# Patient Record
Sex: Female | Born: 1950 | Race: White | Hispanic: No | Marital: Married | Smoking: Never smoker
Health system: Southern US, Community
[De-identification: ages and names within clinical notes are randomized; demographics above are authoritative.]

## PROBLEM LIST (undated history)

## (undated) DIAGNOSIS — I10 Essential (primary) hypertension: Secondary | ICD-10-CM

## (undated) DIAGNOSIS — I493 Ventricular premature depolarization: Secondary | ICD-10-CM

## (undated) DIAGNOSIS — E7800 Pure hypercholesterolemia, unspecified: Secondary | ICD-10-CM

## (undated) DIAGNOSIS — M8580 Other specified disorders of bone density and structure, unspecified site: Secondary | ICD-10-CM

## (undated) HISTORY — PX: NASAL SINUS SURGERY: SHX719

## (undated) HISTORY — DX: Pure hypercholesterolemia, unspecified: E78.00

## (undated) HISTORY — PX: DILATION AND CURETTAGE OF UTERUS: SHX78

## (undated) HISTORY — DX: Other specified disorders of bone density and structure, unspecified site: M85.80

## (undated) HISTORY — DX: Ventricular premature depolarization: I49.3

## (undated) HISTORY — DX: Essential (primary) hypertension: I10

## (undated) HISTORY — PX: HYSTEROSCOPY: SHX211

## (undated) HISTORY — PX: CERVICAL DISC SURGERY: SHX588

## (undated) HISTORY — PX: ELBOW SURGERY: SHX618

## (undated) HISTORY — PX: SHOULDER SURGERY: SHX246

---

## 2002-06-16 ENCOUNTER — Other Ambulatory Visit: Admission: RE | Admit: 2002-06-16 | Discharge: 2002-06-16 | Payer: Self-pay | Admitting: Obstetrics and Gynecology

## 2003-07-12 ENCOUNTER — Other Ambulatory Visit: Admission: RE | Admit: 2003-07-12 | Discharge: 2003-07-12 | Payer: Self-pay | Admitting: Obstetrics and Gynecology

## 2004-05-31 ENCOUNTER — Encounter: Admission: RE | Admit: 2004-05-31 | Discharge: 2004-05-31 | Payer: Self-pay | Admitting: Internal Medicine

## 2004-06-26 ENCOUNTER — Ambulatory Visit (HOSPITAL_COMMUNITY): Admission: RE | Admit: 2004-06-26 | Discharge: 2004-06-26 | Payer: Self-pay | Admitting: Internal Medicine

## 2004-07-17 ENCOUNTER — Ambulatory Visit: Payer: Self-pay

## 2004-07-21 ENCOUNTER — Other Ambulatory Visit: Admission: RE | Admit: 2004-07-21 | Discharge: 2004-07-21 | Payer: Self-pay | Admitting: Obstetrics and Gynecology

## 2005-08-21 ENCOUNTER — Other Ambulatory Visit: Admission: RE | Admit: 2005-08-21 | Discharge: 2005-08-21 | Payer: Self-pay | Admitting: Obstetrics and Gynecology

## 2005-12-21 ENCOUNTER — Ambulatory Visit (HOSPITAL_BASED_OUTPATIENT_CLINIC_OR_DEPARTMENT_OTHER): Admission: RE | Admit: 2005-12-21 | Discharge: 2005-12-21 | Payer: Self-pay | Admitting: Obstetrics and Gynecology

## 2005-12-21 ENCOUNTER — Encounter (INDEPENDENT_AMBULATORY_CARE_PROVIDER_SITE_OTHER): Payer: Self-pay | Admitting: Specialist

## 2006-09-13 ENCOUNTER — Encounter: Admission: RE | Admit: 2006-09-13 | Discharge: 2006-09-13 | Payer: Self-pay | Admitting: Obstetrics and Gynecology

## 2006-09-19 ENCOUNTER — Other Ambulatory Visit: Admission: RE | Admit: 2006-09-19 | Discharge: 2006-09-19 | Payer: Self-pay | Admitting: Obstetrics and Gynecology

## 2007-09-10 ENCOUNTER — Encounter: Admission: RE | Admit: 2007-09-10 | Discharge: 2007-09-10 | Payer: Self-pay | Admitting: Obstetrics and Gynecology

## 2007-10-15 ENCOUNTER — Other Ambulatory Visit: Admission: RE | Admit: 2007-10-15 | Discharge: 2007-10-15 | Payer: Self-pay | Admitting: Obstetrics and Gynecology

## 2008-09-27 ENCOUNTER — Encounter: Admission: RE | Admit: 2008-09-27 | Discharge: 2008-09-27 | Payer: Self-pay | Admitting: Obstetrics and Gynecology

## 2008-11-19 ENCOUNTER — Ambulatory Visit: Payer: Self-pay | Admitting: Obstetrics and Gynecology

## 2008-11-19 ENCOUNTER — Other Ambulatory Visit: Admission: RE | Admit: 2008-11-19 | Discharge: 2008-11-19 | Payer: Self-pay | Admitting: Obstetrics and Gynecology

## 2008-11-19 ENCOUNTER — Encounter: Payer: Self-pay | Admitting: Obstetrics and Gynecology

## 2008-12-02 ENCOUNTER — Ambulatory Visit: Payer: Self-pay | Admitting: Obstetrics and Gynecology

## 2008-12-06 ENCOUNTER — Ambulatory Visit: Payer: Self-pay | Admitting: Obstetrics and Gynecology

## 2009-08-25 ENCOUNTER — Ambulatory Visit (HOSPITAL_BASED_OUTPATIENT_CLINIC_OR_DEPARTMENT_OTHER): Admission: RE | Admit: 2009-08-25 | Discharge: 2009-08-25 | Payer: Self-pay | Admitting: Orthopedic Surgery

## 2009-09-28 ENCOUNTER — Encounter: Admission: RE | Admit: 2009-09-28 | Discharge: 2009-09-28 | Payer: Self-pay | Admitting: Internal Medicine

## 2009-11-23 ENCOUNTER — Other Ambulatory Visit: Admission: RE | Admit: 2009-11-23 | Discharge: 2009-11-23 | Payer: Self-pay | Admitting: Obstetrics and Gynecology

## 2009-11-23 ENCOUNTER — Ambulatory Visit: Payer: Self-pay | Admitting: Obstetrics and Gynecology

## 2009-12-08 ENCOUNTER — Ambulatory Visit: Payer: Self-pay | Admitting: Obstetrics and Gynecology

## 2009-12-21 ENCOUNTER — Ambulatory Visit: Payer: Self-pay | Admitting: Obstetrics and Gynecology

## 2009-12-22 ENCOUNTER — Ambulatory Visit: Payer: Self-pay | Admitting: Obstetrics and Gynecology

## 2010-01-06 ENCOUNTER — Ambulatory Visit: Payer: Self-pay | Admitting: Obstetrics and Gynecology

## 2010-01-17 ENCOUNTER — Ambulatory Visit: Payer: Self-pay | Admitting: Obstetrics and Gynecology

## 2010-09-02 ENCOUNTER — Other Ambulatory Visit: Payer: Self-pay | Admitting: Internal Medicine

## 2010-09-02 DIAGNOSIS — Z1239 Encounter for other screening for malignant neoplasm of breast: Secondary | ICD-10-CM

## 2010-10-06 ENCOUNTER — Ambulatory Visit
Admission: RE | Admit: 2010-10-06 | Discharge: 2010-10-06 | Disposition: A | Discharge status: Home / Self Care | Payer: BC Managed Care – PPO | Source: Ambulatory Visit | Attending: Internal Medicine | Admitting: Internal Medicine

## 2010-10-06 DIAGNOSIS — Z1239 Encounter for other screening for malignant neoplasm of breast: Secondary | ICD-10-CM

## 2010-10-29 LAB — BASIC METABOLIC PANEL
CO2: 27 mEq/L (ref 19–32)
Calcium: 9.8 mg/dL (ref 8.4–10.5)
Glucose, Bld: 98 mg/dL (ref 70–99)
Sodium: 138 mEq/L (ref 135–145)

## 2010-11-01 ENCOUNTER — Ambulatory Visit
Admission: RE | Admit: 2010-11-01 | Discharge: 2010-11-01 | Disposition: A | Discharge status: Home / Self Care | Payer: BC Managed Care – PPO | Source: Ambulatory Visit | Attending: Internal Medicine | Admitting: Internal Medicine

## 2010-11-01 ENCOUNTER — Other Ambulatory Visit: Payer: Self-pay | Admitting: Internal Medicine

## 2010-11-01 DIAGNOSIS — R52 Pain, unspecified: Secondary | ICD-10-CM

## 2010-12-14 ENCOUNTER — Encounter (INDEPENDENT_AMBULATORY_CARE_PROVIDER_SITE_OTHER): Payer: BC Managed Care – PPO | Admitting: Obstetrics and Gynecology

## 2010-12-14 ENCOUNTER — Other Ambulatory Visit (HOSPITAL_COMMUNITY)
Admission: RE | Admit: 2010-12-14 | Discharge: 2010-12-14 | Disposition: A | Discharge status: Home / Self Care | Payer: BC Managed Care – PPO | Source: Ambulatory Visit | Attending: Obstetrics and Gynecology | Admitting: Obstetrics and Gynecology

## 2010-12-14 ENCOUNTER — Other Ambulatory Visit: Payer: Self-pay | Admitting: Obstetrics and Gynecology

## 2010-12-14 DIAGNOSIS — Z01419 Encounter for gynecological examination (general) (routine) without abnormal findings: Secondary | ICD-10-CM

## 2010-12-14 DIAGNOSIS — R823 Hemoglobinuria: Secondary | ICD-10-CM

## 2010-12-14 DIAGNOSIS — Z124 Encounter for screening for malignant neoplasm of cervix: Secondary | ICD-10-CM | POA: Insufficient documentation

## 2010-12-29 NOTE — Op Note (Signed)
[name], Kayla Cannon               ACCOUNT NO.:  [id number]   MEDICAL RECORD NO.:  [id number]          PATIENT TYPE:  AMB   LOCATION:  [facility]                         FACILITY:  [facility]   PHYSICIAN:  Daniel L. Gottsegen, M.D.DATE OF BIRTH:  1951-01-21   DATE OF PROCEDURE:  12/21/2005  DATE OF DISCHARGE:                                 OPERATIVE REPORT   PREOPERATIVE DIAGNOSIS:  Postmenopausal bleeding with abnormal endometrial  cavity.   POSTOPERATIVE DIAGNOSIS:  Postmenopausal bleeding with abnormal endometrial  cavity plus small endometrial polyps.   OPERATIONS:  Hysteroscopy, dilatation and curettage.   SURGEON:  Daniel L. Eda Paschal, M.D.   ANESTHESIA:  General.   INDICATIONS:  The patient is a 60 year old gravida 5, para 2, AB 3, who had  presented to the office after four months of amenorrhea and elevated FSH  with an episode of postmenopausal bleeding.  The patient underwent an  ultrasound which showed an enlarged endometrial cavity. As a result, an  endometrial biopsy was done which came back benign, but after having an  endometrial biopsy the patient started to have hemorrhaging, not immediately  but 1-2 days later. This was controlled with Megace but the concern was that  this was not consistent with just a normal endometrial biopsy and there was  some concern that she had additional intrauterine pathology and therefore  she enters the hospital now for hysteroscopy and appropriate treatment.   FINDINGS:  External is normal, BUS is normal, vaginal is normal.  Cervix is  clean.  Uterus is retroverted, normal size and shape with 1 1/2 degree  uterine descensus.  Adnexa failed to reveal masses although there were some  small cysts on ultrasound.  At the time of hysteroscopy, the patient had  several areas of polypoid tissue mostly near the coronal portions of both  her tubes.  Once these were excised, the cavity looked like a normal  postmenopausal cavity.   PROCEDURE:   After adequate general endotracheal, the patient was placed in  the dorsal lithotomy position, prepped and draped in the usual sterile  manner.  A single-tooth tenaculum was placed on the anterior lip of the  cervix.  The cervix was dilated to #29 [facility] dilator and a hysteroscopic  resectoscope was utilized.  A camera was used for magnification, 3% Sorbitol  was used to distend the intrauterine cavity.  A 90 degree wire loop was  utilized with appropriate Bovie settings.  Pictures were obtained and the  findings were consistent with the above. Using the wire loop, the polypoid  tissue was removed.  Fractional curettage was also done and all tissue was  sent to pathology for tissue diagnosis.  Fluid loss during the procedure was  only 20 mL. Blood loss was minimal.  The patient tolerated the procedure  well and left the operating satisfactory condition.     Daniel L. Eda Paschal, M.D.  Electronically Signed    DLG/MEDQ  D:  12/21/2005  T:  12/22/2005  Job:  [id number]

## 2011-01-19 ENCOUNTER — Other Ambulatory Visit: Payer: Self-pay | Admitting: Otolaryngology

## 2011-01-19 DIAGNOSIS — J321 Chronic frontal sinusitis: Secondary | ICD-10-CM

## 2011-01-23 ENCOUNTER — Ambulatory Visit
Admission: RE | Admit: 2011-01-23 | Discharge: 2011-01-23 | Disposition: A | Discharge status: Home / Self Care | Payer: BC Managed Care – PPO | Source: Ambulatory Visit | Attending: Otolaryngology | Admitting: Otolaryngology

## 2011-01-23 DIAGNOSIS — J321 Chronic frontal sinusitis: Secondary | ICD-10-CM

## 2011-05-17 ENCOUNTER — Telehealth: Payer: Self-pay | Admitting: *Deleted

## 2011-05-17 NOTE — Telephone Encounter (Signed)
She needs to discuss the medications she is on with her OB. Some medications are safe  than others. They may need to adjust her medications due to the pregnancy. Tell Kayla Cannon I'm having extremely busy day but I will try to call her at 5:30 PM. If I can do that please get a phone number were I can reach her than.

## 2011-05-17 NOTE — Telephone Encounter (Signed)
Patient said thank you so much she will be available at 5:30 @ [phone].

## 2011-05-17 NOTE — Telephone Encounter (Signed)
Lm for patient to call

## 2011-05-17 NOTE — Telephone Encounter (Signed)
Patient wanted your advice only.  Her daughter "[name]" lives in [place] and is now pregnant.  She is leaving tomorrow to go see her.  She will have her first ob appointment on Tuesday.  Patient is bipolar, and has been on meds.  Wants to know what kind of questions does she need to ask ob when they go for ov because of her past history? She said she would like to talk to you if possible.

## 2011-09-06 ENCOUNTER — Other Ambulatory Visit: Payer: Self-pay | Admitting: Obstetrics and Gynecology

## 2011-09-06 DIAGNOSIS — Z1231 Encounter for screening mammogram for malignant neoplasm of breast: Secondary | ICD-10-CM

## 2011-10-08 ENCOUNTER — Ambulatory Visit
Admission: RE | Admit: 2011-10-08 | Discharge: 2011-10-08 | Disposition: A | Discharge status: Home / Self Care | Payer: BC Managed Care – PPO | Source: Ambulatory Visit | Attending: Obstetrics and Gynecology | Admitting: Obstetrics and Gynecology

## 2011-10-08 DIAGNOSIS — Z1231 Encounter for screening mammogram for malignant neoplasm of breast: Secondary | ICD-10-CM

## 2011-12-12 ENCOUNTER — Encounter: Payer: Self-pay | Admitting: Gynecology

## 2011-12-12 DIAGNOSIS — M8580 Other specified disorders of bone density and structure, unspecified site: Secondary | ICD-10-CM | POA: Insufficient documentation

## 2011-12-25 ENCOUNTER — Encounter: Payer: Self-pay | Admitting: Obstetrics and Gynecology

## 2011-12-25 ENCOUNTER — Ambulatory Visit (INDEPENDENT_AMBULATORY_CARE_PROVIDER_SITE_OTHER): Payer: BC Managed Care – PPO | Admitting: Obstetrics and Gynecology

## 2011-12-25 VITALS — BP 120/74 | Ht 61.5 in | Wt 174.0 lb

## 2011-12-25 DIAGNOSIS — R82998 Other abnormal findings in urine: Secondary | ICD-10-CM

## 2011-12-25 DIAGNOSIS — I493 Ventricular premature depolarization: Secondary | ICD-10-CM | POA: Insufficient documentation

## 2011-12-25 DIAGNOSIS — Z01419 Encounter for gynecological examination (general) (routine) without abnormal findings: Secondary | ICD-10-CM

## 2011-12-25 DIAGNOSIS — E7800 Pure hypercholesterolemia, unspecified: Secondary | ICD-10-CM | POA: Insufficient documentation

## 2011-12-25 LAB — URINALYSIS W MICROSCOPIC + REFLEX CULTURE
Bilirubin Urine: NEGATIVE
Casts: NONE SEEN
Crystals: NONE SEEN
Glucose, UA: NEGATIVE mg/dL
Protein, ur: NEGATIVE mg/dL
Specific Gravity, Urine: 1.01 (ref 1.005–1.030)

## 2011-12-25 NOTE — Progress Notes (Signed)
Patient came to see me today for her annual GYN exam. She remains on testosterone cream and compounded estrogen cream with good results when she remembers to take them. She is also having some hot flashes but the moment has elected not to take HRT . She is having no vaginal bleeding. She is having no pelvic pain or dyspareunia. She does her lab through PCP. She is up-to-date on mammograms. She is having a followup bone density after being on drug holiday from Fosamax for one year.  Physical examination: Lennox Pippins present. HEENT within normal limits. Neck: Thyroid not large. No masses. Supraclavicular nodes: not enlarged. Breasts: Examined in both sitting and lying  position. No skin changes and no masses. Abdomen: Soft no guarding rebound or masses or hernia. Pelvic: External: Within normal limits. BUS: Within normal limits. Vaginal:within normal limits. Good estrogen effect. No evidence of cystocele rectocele or enterocele. Cervix: clean. Uterus: Normal size and shape. Adnexa: No masses. Rectovaginal exam: Confirmatory and negative. Extremities: Within normal limits.  Assessment: Menopausal symptoms  Plan: For the moment continue estradiol vaginal cream 0.02% and testosterone cream 2%. Discussed newer products that may be available. Patient will be interested in a combination estrogen/serm. Continue yearly mammograms. Bone density scheduled.

## 2011-12-27 ENCOUNTER — Other Ambulatory Visit: Payer: Self-pay | Admitting: Obstetrics and Gynecology

## 2011-12-27 ENCOUNTER — Ambulatory Visit (INDEPENDENT_AMBULATORY_CARE_PROVIDER_SITE_OTHER): Payer: BC Managed Care – PPO

## 2011-12-27 DIAGNOSIS — M949 Disorder of cartilage, unspecified: Secondary | ICD-10-CM

## 2011-12-27 DIAGNOSIS — M8580 Other specified disorders of bone density and structure, unspecified site: Secondary | ICD-10-CM

## 2011-12-28 ENCOUNTER — Other Ambulatory Visit: Payer: Self-pay | Admitting: Obstetrics and Gynecology

## 2011-12-28 DIAGNOSIS — R3129 Other microscopic hematuria: Secondary | ICD-10-CM

## 2012-01-04 ENCOUNTER — Encounter: Payer: Self-pay | Admitting: Obstetrics and Gynecology

## 2012-01-08 ENCOUNTER — Encounter: Payer: Self-pay | Admitting: Obstetrics and Gynecology

## 2012-01-23 ENCOUNTER — Other Ambulatory Visit: Payer: BC Managed Care – PPO

## 2012-01-23 DIAGNOSIS — R3129 Other microscopic hematuria: Secondary | ICD-10-CM

## 2012-01-24 ENCOUNTER — Other Ambulatory Visit: Payer: Self-pay | Admitting: *Deleted

## 2012-01-24 DIAGNOSIS — Z87448 Personal history of other diseases of urinary system: Secondary | ICD-10-CM

## 2012-01-24 LAB — URINALYSIS W MICROSCOPIC + REFLEX CULTURE
Bilirubin Urine: NEGATIVE
Crystals: NONE SEEN
Glucose, UA: NEGATIVE mg/dL
Specific Gravity, Urine: 1.016 (ref 1.005–1.030)
Squamous Epithelial / LPF: NONE SEEN
pH: 7 (ref 5.0–8.0)

## 2012-07-02 ENCOUNTER — Telehealth: Payer: Self-pay | Admitting: *Deleted

## 2012-07-02 NOTE — Telephone Encounter (Signed)
Pharmacy [place] called, pt requesting refill on estradiol vaginal cream 0.02% and testosterone cream 2% per office note okay for pt to continue. She will call if change needed.

## 2012-07-30 ENCOUNTER — Other Ambulatory Visit: Payer: BC Managed Care – PPO

## 2012-07-30 DIAGNOSIS — Z87448 Personal history of other diseases of urinary system: Secondary | ICD-10-CM

## 2012-07-31 LAB — URINALYSIS W MICROSCOPIC + REFLEX CULTURE
Bilirubin Urine: NEGATIVE
Crystals: NONE SEEN
Glucose, UA: NEGATIVE mg/dL
Ketones, ur: NEGATIVE mg/dL
Protein, ur: NEGATIVE mg/dL
Urobilinogen, UA: 0.2 mg/dL (ref 0.0–1.0)

## 2012-08-02 LAB — URINE CULTURE

## 2012-08-07 ENCOUNTER — Other Ambulatory Visit: Payer: Self-pay | Admitting: *Deleted

## 2012-08-07 DIAGNOSIS — N390 Urinary tract infection, site not specified: Secondary | ICD-10-CM

## 2012-08-07 MED ORDER — NITROFURANTOIN MONOHYD MACRO 100 MG PO CAPS: 100.0000 mg | ORAL_CAPSULE | Freq: Two times a day (BID) | ORAL | Status: DC

## 2012-09-01 ENCOUNTER — Other Ambulatory Visit: Payer: Self-pay | Admitting: Gynecology

## 2012-09-01 ENCOUNTER — Other Ambulatory Visit: Payer: BC Managed Care – PPO

## 2012-09-01 DIAGNOSIS — Z1231 Encounter for screening mammogram for malignant neoplasm of breast: Secondary | ICD-10-CM

## 2012-09-01 DIAGNOSIS — N390 Urinary tract infection, site not specified: Secondary | ICD-10-CM

## 2012-09-02 LAB — URINALYSIS W MICROSCOPIC + REFLEX CULTURE
Bacteria, UA: NONE SEEN
Casts: NONE SEEN
Glucose, UA: NEGATIVE mg/dL
Hgb urine dipstick: NEGATIVE
Ketones, ur: NEGATIVE mg/dL
Leukocytes, UA: NEGATIVE
Protein, ur: NEGATIVE mg/dL

## 2012-09-12 ENCOUNTER — Ambulatory Visit (INDEPENDENT_AMBULATORY_CARE_PROVIDER_SITE_OTHER): Payer: BC Managed Care – PPO | Admitting: Women's Health

## 2012-09-12 ENCOUNTER — Telehealth: Payer: Self-pay

## 2012-09-12 DIAGNOSIS — R1031 Right lower quadrant pain: Secondary | ICD-10-CM

## 2012-09-12 DIAGNOSIS — N390 Urinary tract infection, site not specified: Secondary | ICD-10-CM

## 2012-09-12 LAB — URINALYSIS W MICROSCOPIC + REFLEX CULTURE
Glucose, UA: NEGATIVE mg/dL
Nitrite: NEGATIVE
Protein, ur: NEGATIVE mg/dL
Urobilinogen, UA: 0.2 mg/dL (ref 0.0–1.0)

## 2012-09-12 MED ORDER — NITROFURANTOIN MACROCRYSTAL 50 MG PO CAPS: ORAL_CAPSULE | ORAL | Status: DC

## 2012-09-12 NOTE — Telephone Encounter (Signed)
Patient called to check urine result from 09/01/12.  Patient informed normal, no culture done.  She is complaining of some pain in her side/ovary on Monday and would like to schedule appointment. Transferred to appointment desk to schedule.

## 2012-09-12 NOTE — Progress Notes (Signed)
Patient ID: Kayla Cannon, female   DOB: 1950/10/05, 62 y.o.   MRN: [id number] Presents with a complaint of a pain in right lower quadrant that lasted for 1-1/2 hours 4 days ago. Has had this pain off and on usually lasting only minutes for the last few years. History of asymptomatic UTIs that are found at annual exam. With review of records one to 2 UTIs with positive cultures yearly. Denies a fever, vaginal discharge, pain or burning or frequency with urination. Father with history of kidney cancer. Treated for UTI with Macrobid 08/01/12.  Exam: No CVAT, external genitalia within normal limits, speculum exam no discharge noted, bimanual no CMT or adnexal fullness or tenderness with exam. UA: Moderate leukocytes, 7-10 WBCs, few bacteria  Right lower quadrant pain intermittent in nature unknown cause History of asymptomatic UTIs  Plan: Urine culture pending, Macrodantin 50 mg with intercourse. Instructed to call office if pain would reoccur and will do ultrasound.

## 2012-09-12 NOTE — Patient Instructions (Addendum)
Urinary Tract Infection Urinary tract infections (UTIs) can develop anywhere along your urinary tract. Your urinary tract is your body's drainage system for removing wastes and extra water. Your urinary tract includes two kidneys, two ureters, a bladder, and a urethra. Your kidneys are a pair of bean-shaped organs. Each kidney is about the size of your fist. They are located below your ribs, one on each side of your spine. CAUSES Infections are caused by microbes, which are microscopic organisms, including fungi, viruses, and bacteria. These organisms are so small that they can only be seen through a microscope. Bacteria are the microbes that most commonly cause UTIs. SYMPTOMS  Symptoms of UTIs may vary by age and gender of the patient and by the location of the infection. Symptoms in Christianne Zacher women typically include a frequent and intense urge to urinate and a painful, burning feeling in the bladder or urethra during urination. Older women and men are more likely to be tired, shaky, and weak and have muscle aches and abdominal pain. A fever may mean the infection is in your kidneys. Other symptoms of a kidney infection include pain in your back or sides below the ribs, nausea, and vomiting. DIAGNOSIS To diagnose a UTI, your caregiver will ask you about your symptoms. Your caregiver also will ask to provide a urine sample. The urine sample will be tested for bacteria and white blood cells. White blood cells are made by your body to help fight infection. TREATMENT  Typically, UTIs can be treated with medication. Because most UTIs are caused by a bacterial infection, they usually can be treated with the use of antibiotics. The choice of antibiotic and length of treatment depend on your symptoms and the type of bacteria causing your infection. HOME CARE INSTRUCTIONS  If you were prescribed antibiotics, take them exactly as your caregiver instructs you. Finish the medication even if you feel better after you  have only taken some of the medication.  Drink enough water and fluids to keep your urine clear or pale yellow.  Avoid caffeine, tea, and carbonated beverages. They tend to irritate your bladder.  Empty your bladder often. Avoid holding urine for long periods of time.  Empty your bladder before and after sexual intercourse.  After a bowel movement, women should cleanse from front to back. Use each tissue only once. SEEK MEDICAL CARE IF:   You have back pain.  You develop a fever.  Your symptoms do not begin to resolve within 3 days. SEEK IMMEDIATE MEDICAL CARE IF:   You have severe back pain or lower abdominal pain.  You develop chills.  You have nausea or vomiting.  You have continued burning or discomfort with urination. MAKE SURE YOU:   Understand these instructions.  Will watch your condition.  Will get help right away if you are not doing well or get worse. Document Released: 05/09/2005 Document Revised: 01/29/2012 Document Reviewed: 09/07/2011 Exit[employer] Patient Information 2013 [employer].  

## 2012-09-14 LAB — URINE CULTURE: Colony Count: 75000

## 2012-09-17 ENCOUNTER — Encounter: Payer: Self-pay | Admitting: Gynecology

## 2012-09-27 ENCOUNTER — Other Ambulatory Visit: Payer: Self-pay

## 2012-10-08 ENCOUNTER — Ambulatory Visit: Payer: BC Managed Care – PPO

## 2012-10-08 ENCOUNTER — Ambulatory Visit
Admission: RE | Admit: 2012-10-08 | Discharge: 2012-10-08 | Disposition: A | Discharge status: Home / Self Care | Payer: BC Managed Care – PPO | Source: Ambulatory Visit | Attending: Gynecology | Admitting: Gynecology

## 2012-10-08 DIAGNOSIS — Z1231 Encounter for screening mammogram for malignant neoplasm of breast: Secondary | ICD-10-CM

## 2012-10-14 ENCOUNTER — Telehealth: Payer: Self-pay | Admitting: *Deleted

## 2012-10-14 NOTE — Telephone Encounter (Signed)
Pt called c/o severe pain in right ovary last night, and dull pain this am. I called pt back and told her to go to ER to be seen due to pain. JF & TF are out for surgery and [place] is off on Tuesday. Pt said that pain is not bad now, and would like to have OV scheduled for tomorrow, I told her if pain should occur again to go to ER tonight . Transferred pt to front desk.

## 2012-10-15 ENCOUNTER — Encounter: Payer: Self-pay | Admitting: Gynecology

## 2012-10-15 ENCOUNTER — Ambulatory Visit (INDEPENDENT_AMBULATORY_CARE_PROVIDER_SITE_OTHER): Payer: BC Managed Care – PPO | Admitting: Gynecology

## 2012-10-15 DIAGNOSIS — Z1211 Encounter for screening for malignant neoplasm of colon: Secondary | ICD-10-CM

## 2012-10-15 DIAGNOSIS — R102 Pelvic and perineal pain: Secondary | ICD-10-CM

## 2012-10-15 DIAGNOSIS — N760 Acute vaginitis: Secondary | ICD-10-CM

## 2012-10-15 DIAGNOSIS — A499 Bacterial infection, unspecified: Secondary | ICD-10-CM

## 2012-10-15 DIAGNOSIS — N949 Unspecified condition associated with female genital organs and menstrual cycle: Secondary | ICD-10-CM

## 2012-10-15 DIAGNOSIS — B9689 Other specified bacterial agents as the cause of diseases classified elsewhere: Secondary | ICD-10-CM

## 2012-10-15 LAB — URINALYSIS W MICROSCOPIC + REFLEX CULTURE
Bilirubin Urine: NEGATIVE
Ketones, ur: NEGATIVE mg/dL
Protein, ur: NEGATIVE mg/dL
Urobilinogen, UA: 0.2 mg/dL (ref 0.0–1.0)

## 2012-10-15 LAB — WET PREP FOR TRICH, YEAST, CLUE: Trich, Wet Prep: NONE SEEN

## 2012-10-15 MED ORDER — CLINDAMYCIN PHOSPHATE 2 % VA CREA: 1.0000 | TOPICAL_CREAM | Freq: Every day | VAGINAL | Status: DC

## 2012-10-15 NOTE — Progress Notes (Signed)
Patient presents complaining of right lower quadrant pain. She saw Harriett Sine in January with fleeting right lower quadrant discomfort. Her exam was normal and notes she did well until the last several days when she had a recurrence of nagging aching pain which seems to be resolving now. She did have a little constipation last night. No diarrhea fever chills nausea vomiting. No urinary symptoms such as frequency dysuria urgency noted.  Exam with Selena Batten assistant Spine straight no CVA tenderness. Abdomen soft nontender without masses guarding rebound or organomegaly. Active bowel sounds throughout. Pelvic external BUS vagina with whitish discharge. Cervix normal. Uterus normal size midline mobile nontender. Adnexa without masses or tenderness.  Rectovaginal exam is normal. Stool positive for blood.  Assessment and plan: 1. Vaginal discharge. Wet prep suggestive of bacterial vaginosis. Patient's asymptomatic and options to treat or not treat reviewed. Treatment options also reviewed and patient elects for Cleocin vaginal cream nightly x7 days. 2. Right lower quadrant discomfort.  Digital rectal exam with stool positive for blood. Urinalysis is unremarkable follow up with culture. Reviewed false positive nature of digital rectal exam blood checks. Also raises possibility for colonic source of pain. Does report colonoscopy several years ago by Dr. Kinnie Scales. Recommend that she followup for evaluation now with him given the pain and blood and she agrees with this and we'll help arrange it.  I do recommend baseline ultrasound now for ovarian surveillance just to make sure this is not the source of her pain and she will followup for this.

## 2012-10-15 NOTE — Patient Instructions (Signed)
Followup with Dr. Kinnie Scales for evaluation of the positive blood in the stool and right lower quadrant pain. Followup here for vaginal ultrasound to assess ovaries. Use Cleocin vaginal cream nightly x1 week for vaginal discharge.

## 2012-10-16 ENCOUNTER — Telehealth: Payer: Self-pay | Admitting: *Deleted

## 2012-10-16 NOTE — Telephone Encounter (Signed)
Office notes faxed to [facility] office, pt will call office and make OV to fit her schedule.

## 2012-10-16 NOTE — Telephone Encounter (Signed)
Message copied by Aura Camps on Thu Oct 16, 2012  9:24 AM ------      Message from: Dara Lords      Created: Wed Oct 15, 2012 12:54 PM       Help patient arrange appointment with Dr. Kinnie Scales, gastroenterologist due to history of right lower quadrant pain and positive blood in her stool. ------

## 2012-10-17 LAB — URINE CULTURE: Colony Count: NO GROWTH

## 2012-10-22 ENCOUNTER — Ambulatory Visit (INDEPENDENT_AMBULATORY_CARE_PROVIDER_SITE_OTHER): Payer: BC Managed Care – PPO

## 2012-10-22 ENCOUNTER — Encounter: Payer: Self-pay | Admitting: Gynecology

## 2012-10-22 ENCOUNTER — Ambulatory Visit (INDEPENDENT_AMBULATORY_CARE_PROVIDER_SITE_OTHER): Payer: BC Managed Care – PPO | Admitting: Gynecology

## 2012-10-22 DIAGNOSIS — D259 Leiomyoma of uterus, unspecified: Secondary | ICD-10-CM

## 2012-10-22 DIAGNOSIS — N949 Unspecified condition associated with female genital organs and menstrual cycle: Secondary | ICD-10-CM

## 2012-10-22 DIAGNOSIS — G8929 Other chronic pain: Secondary | ICD-10-CM

## 2012-10-22 DIAGNOSIS — N83339 Acquired atrophy of ovary and fallopian tube, unspecified side: Secondary | ICD-10-CM

## 2012-10-22 DIAGNOSIS — R102 Pelvic and perineal pain: Secondary | ICD-10-CM

## 2012-10-22 DIAGNOSIS — D251 Intramural leiomyoma of uterus: Secondary | ICD-10-CM

## 2012-10-22 DIAGNOSIS — R1031 Right lower quadrant pain: Secondary | ICD-10-CM

## 2012-10-22 NOTE — Patient Instructions (Signed)
Followup with Dr. Kinnie Scales for colonoscopy. Call me if your abdominal/pelvic pain continues.

## 2012-10-22 NOTE — Progress Notes (Signed)
Patient follows up for ultrasound due to her right lower quadrant pain. She saw Dr. Kinnie Scales who does not think that it's diverticulitis or other GI pathology.  She is scheduled for colonoscopy in June.  Ultrasound shows uterus overall normal in size with small 21 mm helmet. Endometrial echo 1.5 mm. Right and left ovaries visualized, atrophic. Cul-de-sac negative. Excessive bowel gas was noted in the right pelvis.  Assessment and plan:  Right lower abdominal/pelvic pain.  I suspect GI related particularly with the excessive gas noted. Recommended starting Metamucil daily with fluids. Followup with Dr. Kinnie Scales for colonoscopy. If her pain continues possible urologic referral. Her UA was negative when I saw her without evidence of microscopic hematuria to suggest stone. She asked about dropping off a urine routinely just to make sure that she doesn't have an asymptomatic urinary tract infection as she apparently has had these in the past. I discussed the pros and cons of screening and treating asymptomatic bacteria at this point would recommend followup if she develops symptoms such as frequency dysuria but not to routinely screen her. Patient is comfortable with this approach.

## 2013-01-07 ENCOUNTER — Encounter: Payer: Self-pay | Admitting: Gynecology

## 2013-01-16 ENCOUNTER — Telehealth: Payer: Self-pay | Admitting: *Deleted

## 2013-01-16 ENCOUNTER — Encounter: Payer: Self-pay | Admitting: Gynecology

## 2013-01-16 ENCOUNTER — Ambulatory Visit (INDEPENDENT_AMBULATORY_CARE_PROVIDER_SITE_OTHER): Payer: BC Managed Care – PPO | Admitting: Gynecology

## 2013-01-16 VITALS — BP 102/62 | Ht 61.25 in | Wt 144.0 lb

## 2013-01-16 DIAGNOSIS — Z01419 Encounter for gynecological examination (general) (routine) without abnormal findings: Secondary | ICD-10-CM

## 2013-01-16 DIAGNOSIS — N952 Postmenopausal atrophic vaginitis: Secondary | ICD-10-CM

## 2013-01-16 DIAGNOSIS — M8580 Other specified disorders of bone density and structure, unspecified site: Secondary | ICD-10-CM

## 2013-01-16 DIAGNOSIS — M899 Disorder of bone, unspecified: Secondary | ICD-10-CM

## 2013-01-16 NOTE — Telephone Encounter (Signed)
Message copied by Aura Camps on Fri Jan 16, 2013  2:44 PM ------      Message from: Dara Lords      Created: Fri Jan 16, 2013 12:56 PM       Patient needs refill of her vaginal estrogen cream and 2% testosterone cream as prescribed previously by Dr. Eda Paschal called into [place] [facility]. Use the same sig as was used by Dr. Eda Paschal. ------

## 2013-01-16 NOTE — Telephone Encounter (Signed)
Both Rx called into pharmacy, spoke with [facility] about rx and they will fill for pt.

## 2013-01-16 NOTE — Patient Instructions (Signed)
Followup in one year for annual exam, sooner if any issues 

## 2013-01-16 NOTE — Progress Notes (Signed)
CLARYSSA SANDNER 1951-02-04 [id number]        62 y.o.  [id number] for annual exam.  Doing well without complaints.  Past medical history,surgical history, medications, allergies, family history and social history were all reviewed and documented in the EPIC chart.  ROS:  Performed and pertinent positives and negatives are included in the history, assessment and plan .  Exam: Sherrilyn Rist assistant Filed Vitals:   01/16/13 1129  BP: 102/62  Height: 5' 1.25" (1.556 m)  Weight: 144 lb (65.318 kg)   General appearance  Normal Skin grossly normal Head/Neck normal with no cervical or supraclavicular adenopathy thyroid normal Lungs  clear Cardiac RR, without RMG Abdominal  soft, nontender, without masses, organomegaly or hernia Breasts  examined lying and sitting without masses, retractions, discharge or axillary adenopathy. Pelvic  Ext/BUS/vagina  normal   Cervix  normal   Uterus  anteverted, normal size, shape and contour, midline and mobile nontender   Adnexa  Without masses or tenderness    Anus and perineum  normal   Rectovaginal  normal sphincter tone without palpated masses or tenderness.    Assessment/Plan:  62 y.o. [id number] female for annual exam.   1. Postmenopausal/atrophic vaginitis. Patient using estradiol vaginal cream twice weekly although admits to using it less frequently. Is having issues with vaginal dryness and dyspareunia. Is having some hot flashes but not overly bothersome. Also using testosterone 2% cream applied twice weekly periclitoraly.  I reviewed the whole issue of HRT with her to include the WHI study with increased risk of stroke, heart attack, DVT. The ACOG and NAMS statements for lowest dose for the shortest period of time reviewed. Transdermal versus oral first-pass effect benefit discussed. She is not having issues that she wants to consider systemic HRT. I discussed vaginal options to include estradiol cream, Vagifem, Osphena. Possible absorption issues with risks  noted above discussed. Patient's comfortable with continuing the Estrace vaginal cream and we'll refill times a year. I reviewed the testosterone cream and potential absorption with levels and risks to include weight gain hair growth acne adverse lipid profile. Patient's comfortable continuing I refilled her also. Patient knows to report any vaginal bleeding. 2. Mammography 09/2012. Continued annual mammography. SBE monthly reviewed. 3. Pap smear 2012. No Pap smear done today. No history of significant abnormal Pap smears. Plan repeat next year at 3 year interval. 4. Osteopenia. DEXA 12/2011 T score -1.8. FRAX not done because she had been on Fosamax for approximately 6 years per her history. Currently on drug-free holiday. Recommend repeat next year a 2 year interval increased calcium vitamin D. 5. Colonoscopy 12/2012. Per prior notes she was having abdominal pain and this has resolved after her colonoscopy she is doing well. 6. Health maintenance. No blood work done as this is all done through her primary physician's office. Check UA as she does have a history of urinary tract infections before. Followup one year, sooner as needed.    Dara Lords MD, 12:51 PM 01/16/2013

## 2013-01-17 LAB — URINALYSIS W MICROSCOPIC + REFLEX CULTURE
Crystals: NONE SEEN
Nitrite: NEGATIVE
Specific Gravity, Urine: 1.015 (ref 1.005–1.030)
Squamous Epithelial / LPF: NONE SEEN
Urobilinogen, UA: 0.2 mg/dL (ref 0.0–1.0)

## 2013-06-09 ENCOUNTER — Other Ambulatory Visit (HOSPITAL_COMMUNITY): Payer: Self-pay | Admitting: Internal Medicine

## 2013-06-09 DIAGNOSIS — I2510 Atherosclerotic heart disease of native coronary artery without angina pectoris: Secondary | ICD-10-CM

## 2013-06-18 ENCOUNTER — Other Ambulatory Visit: Payer: Self-pay

## 2013-07-02 ENCOUNTER — Encounter (HOSPITAL_COMMUNITY): Payer: BC Managed Care – PPO

## 2013-07-07 ENCOUNTER — Ambulatory Visit (HOSPITAL_COMMUNITY)
Admission: RE | Admit: 2013-07-07 | Discharge: 2013-07-07 | Disposition: A | Discharge status: Home / Self Care | Payer: BC Managed Care – PPO | Source: Ambulatory Visit | Attending: Internal Medicine | Admitting: Internal Medicine

## 2013-07-07 ENCOUNTER — Other Ambulatory Visit: Payer: Self-pay

## 2013-07-07 DIAGNOSIS — R5381 Other malaise: Secondary | ICD-10-CM | POA: Insufficient documentation

## 2013-07-07 DIAGNOSIS — I2510 Atherosclerotic heart disease of native coronary artery without angina pectoris: Secondary | ICD-10-CM

## 2013-07-07 DIAGNOSIS — Z8249 Family history of ischemic heart disease and other diseases of the circulatory system: Secondary | ICD-10-CM | POA: Insufficient documentation

## 2013-09-07 ENCOUNTER — Other Ambulatory Visit: Payer: Self-pay

## 2013-09-07 DIAGNOSIS — Z1231 Encounter for screening mammogram for malignant neoplasm of breast: Secondary | ICD-10-CM

## 2013-09-21 ENCOUNTER — Ambulatory Visit (INDEPENDENT_AMBULATORY_CARE_PROVIDER_SITE_OTHER): Payer: BLUE CROSS/BLUE SHIELD | Admitting: *Deleted

## 2013-09-21 ENCOUNTER — Encounter: Payer: Self-pay | Admitting: Cardiology

## 2013-09-21 DIAGNOSIS — Z0189 Encounter for other specified special examinations: Secondary | ICD-10-CM

## 2013-09-21 DIAGNOSIS — Z7689 Persons encountering health services in other specified circumstances: Secondary | ICD-10-CM

## 2013-09-21 NOTE — Progress Notes (Signed)
Pt here for EKG only prior to starting a new medication prescribed by Dr. Earlean Shawl.  Dr. Ron Parker (DOD) reviewed EKG, copy faxed to Dr. Earlean Shawl.  Pt escorted to exit in no distress.

## 2013-09-22 ENCOUNTER — Other Ambulatory Visit (HOSPITAL_COMMUNITY): Payer: Self-pay | Admitting: Cardiology

## 2013-09-22 ENCOUNTER — Ambulatory Visit (HOSPITAL_COMMUNITY): Payer: BC Managed Care – PPO | Attending: Internal Medicine

## 2013-09-22 DIAGNOSIS — M79609 Pain in unspecified limb: Secondary | ICD-10-CM

## 2013-09-22 DIAGNOSIS — E785 Hyperlipidemia, unspecified: Secondary | ICD-10-CM | POA: Insufficient documentation

## 2013-09-22 DIAGNOSIS — R29898 Other symptoms and signs involving the musculoskeletal system: Secondary | ICD-10-CM

## 2013-09-22 DIAGNOSIS — M7989 Other specified soft tissue disorders: Secondary | ICD-10-CM

## 2013-09-22 DIAGNOSIS — R609 Edema, unspecified: Secondary | ICD-10-CM

## 2013-10-09 ENCOUNTER — Ambulatory Visit: Payer: Self-pay

## 2013-10-27 ENCOUNTER — Ambulatory Visit
Admission: RE | Admit: 2013-10-27 | Discharge: 2013-10-27 | Disposition: A | Discharge status: Home / Self Care | Payer: BC Managed Care – PPO | Source: Ambulatory Visit

## 2013-10-27 DIAGNOSIS — Z1231 Encounter for screening mammogram for malignant neoplasm of breast: Secondary | ICD-10-CM

## 2013-11-16 ENCOUNTER — Ambulatory Visit (INDEPENDENT_AMBULATORY_CARE_PROVIDER_SITE_OTHER): Payer: BC Managed Care – PPO | Admitting: Podiatry

## 2013-11-16 ENCOUNTER — Ambulatory Visit (INDEPENDENT_AMBULATORY_CARE_PROVIDER_SITE_OTHER): Payer: BC Managed Care – PPO

## 2013-11-16 ENCOUNTER — Encounter: Payer: Self-pay | Admitting: Podiatry

## 2013-11-16 VITALS — BP 130/68 | HR 58 | Resp 16 | Ht 61.0 in | Wt 140.0 lb

## 2013-11-16 DIAGNOSIS — M2040 Other hammer toe(s) (acquired), unspecified foot: Secondary | ICD-10-CM

## 2013-11-16 DIAGNOSIS — M2010 Hallux valgus (acquired), unspecified foot: Secondary | ICD-10-CM

## 2013-11-16 DIAGNOSIS — M779 Enthesopathy, unspecified: Secondary | ICD-10-CM

## 2013-11-16 MED ORDER — TRIAMCINOLONE ACETONIDE 10 MG/ML IJ SUSP
10.0000 mg | Freq: Once | INTRAMUSCULAR | Status: AC
  Administered 2013-11-16: 10 mg

## 2013-11-16 NOTE — Progress Notes (Signed)
   Subjective:    Patient ID: Kayla Cannon, female    DOB: 02-13-1951, 63 y.o.   MRN: [id number]  HPI Comments: "I have pain in this little toe"  Patient c/o of aching 5th toe right for 3 weeks. The area gets red and appears swollen sometimes. Slightly callused. No injury. More painful with shoes. No treatment.  Patient also concerned about bunions bilateral.     Review of Systems  All other systems reviewed and are negative.       Objective:   Physical Exam        Assessment & Plan:

## 2013-11-16 NOTE — Patient Instructions (Signed)

## 2013-11-16 NOTE — Progress Notes (Signed)
Subjective:     Patient ID: Kayla Cannon, female   DOB: 08-15-1950, 63 y.o.   MRN: [id number]  Toe Pain    patient presents stating the little toe on my right foot has been aching for the last few weeks and I do have painful bunions left over right that I know I need to get fix Sunday. Patient has a family history of this with her grandmother having had significant structural bunion deformity. Patient did state she wore tighter shoes a month ago before the toe started to hurt   Review of Systems  All other systems reviewed and are negative.       Objective:   Physical Exam  Nursing note and vitals reviewed. Constitutional: She is oriented to person, place, and time.  Cardiovascular: Intact distal pulses.   Musculoskeletal: Normal range of motion.  Neurological: She is oriented to person, place, and time.  Skin: Skin is warm.   neurovascular status intact with range of motion of the subtalar midtarsal joint adequate and normal muscle strength. 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 Went ahead today and did a careful capsular injection right 3 mg Kenalog 5 mg Xylocaine Marcaine mixture to reduce inflammation and discussed structural bunion correction which she wants to have do at one point in the next year. I reviewed Altamese [place] which would be an appropriate procedure for her left foot

## 2013-11-25 ENCOUNTER — Ambulatory Visit: Payer: Self-pay | Admitting: Podiatry

## 2013-11-27 ENCOUNTER — Other Ambulatory Visit: Payer: Self-pay

## 2013-11-27 MED ORDER — NONFORMULARY OR COMPOUNDED ITEM: Status: DC

## 2013-11-30 ENCOUNTER — Ambulatory Visit: Payer: BC Managed Care – PPO | Admitting: Podiatry

## 2014-01-20 ENCOUNTER — Encounter: Payer: BC Managed Care – PPO | Admitting: Gynecology

## 2014-02-10 ENCOUNTER — Other Ambulatory Visit (HOSPITAL_COMMUNITY)
Admission: RE | Admit: 2014-02-10 | Discharge: 2014-02-10 | Disposition: A | Discharge status: Home / Self Care | Payer: BC Managed Care – PPO | Source: Ambulatory Visit | Attending: Gynecology | Admitting: Gynecology

## 2014-02-10 ENCOUNTER — Ambulatory Visit (INDEPENDENT_AMBULATORY_CARE_PROVIDER_SITE_OTHER): Payer: BC Managed Care – PPO | Admitting: Gynecology

## 2014-02-10 ENCOUNTER — Encounter: Payer: Self-pay | Admitting: Gynecology

## 2014-02-10 VITALS — BP 122/74 | Ht 61.5 in | Wt 141.0 lb

## 2014-02-10 DIAGNOSIS — M899 Disorder of bone, unspecified: Secondary | ICD-10-CM

## 2014-02-10 DIAGNOSIS — Z01419 Encounter for gynecological examination (general) (routine) without abnormal findings: Secondary | ICD-10-CM

## 2014-02-10 DIAGNOSIS — Z1151 Encounter for screening for human papillomavirus (HPV): Secondary | ICD-10-CM | POA: Insufficient documentation

## 2014-02-10 DIAGNOSIS — M8580 Other specified disorders of bone density and structure, unspecified site: Secondary | ICD-10-CM

## 2014-02-10 DIAGNOSIS — N952 Postmenopausal atrophic vaginitis: Secondary | ICD-10-CM

## 2014-02-10 DIAGNOSIS — M949 Disorder of cartilage, unspecified: Secondary | ICD-10-CM

## 2014-02-10 HISTORY — DX: Other specified disorders of bone density and structure, unspecified site: M85.80

## 2014-02-10 MED ORDER — NONFORMULARY OR COMPOUNDED ITEM: Status: DC

## 2014-02-10 NOTE — Progress Notes (Signed)
Kayla Cannon 03/08/1951 [id number]        63 y.o.  [id number] for annual exam.   Several issues noted below.  Past medical history,surgical history, problem list, medications, allergies, family history and social history were all reviewed and documented as reviewed in the EPIC chart.  ROS:  12 system ROS performed with pertinent positives and negatives included in the history, assessment and plan.   Additional significant findings :  None   Exam: Kayla Cannon Vitals:   02/10/14 1146  BP: 122/74  Height: 5' 1.5" (1.562 m)  Weight: 141 lb (63.957 kg)   General appearance:  Normal affect, orientation and appearance. Skin: Grossly normal HEENT: Without gross lesions.  No cervical or supraclavicular adenopathy. Thyroid normal.  Lungs:  Clear without wheezing, rales or rhonchi Cardiac: RR, without RMG Abdominal:  Soft, nontender, without masses, guarding, rebound, organomegaly or hernia Breasts:  Examined lying and sitting without masses, retractions, discharge or axillary adenopathy. Pelvic:  Ext/BUS/vagina with mild atrophic changes  Cervix with stenotic os. Pap/HPV  Uterus retroverted, normal size, shape and contour, midline and mobile nontender   Adnexa  Without masses or tenderness    Anus and perineum  Normal   Rectovaginal  Normal sphincter tone without palpated masses or tenderness.    Assessment/Plan:  63 y.o. [id number] female for annual exam.   1. Vaginal estradiol cream/testosterone cream. Patient continues using the formulated estradiol cream twice weekly and the 2% testosterone cream. Periclitorally twice weekly.  Is doing well with this and wants to continue. I again reviewed the issues of absorption and risks to include stroke heart attack DVT breast cancer endometrial stimulation and issues of hyperandrogenicity.  Patient's comfortable with this and wants to continue it I refilled her x1 year. Patient knows to report any vaginal bleeding. 2. Osteopenia. DEXA 12/2011  T score -1.8. FRAX not done due to prior his phosphate use. Had been on Fosamax for approximately 6 years. Repeat DEXA now. Increase calcium vitamin D reviewed. 3. Mammography 10/2011. Continue with annual mammography. SBE monthly reviewed. 4. Pap smear 2012. Pap/HPV today. Cervix appears stenotic. No history of abnormal Pap smears previously. 5. Colonoscopy 2014. Repeat at their recommended interval. 6. Health maintenance. No blood work done as this is done through her primary physician's office. Followup one year, sooner as needed.   Note: This document was prepared with digital dictation and possible smart phrase technology. Any transcriptional errors that result from this process are unintentional.   Kayla Auerbach MD, 12:12 PM 02/10/2014

## 2014-02-10 NOTE — Patient Instructions (Signed)
Follow up for bone density as scheduled.  You may obtain a copy of any labs that were done today by logging onto MyChart as outlined in the instructions provided with your AVS (after visit summary). The office will not call with normal lab results but certainly if there are any significant abnormalities then we will contact you.   Health Maintenance, Female A healthy lifestyle and preventative care can promote health and wellness.  Maintain regular health, dental, and eye exams.  Eat a healthy diet. Foods like vegetables, fruits, whole grains, low-fat dairy products, and lean protein foods contain the nutrients you need without too many calories. Decrease your intake of foods high in solid fats, added sugars, and salt. Get information about a proper diet from your caregiver, if necessary.  Regular physical exercise is one of the most important things you can do for your health. Most adults should get at least 150 minutes of moderate-intensity exercise (any activity that increases your heart rate and causes you to sweat) each week. In addition, most adults need muscle-strengthening exercises on 2 or more days a week.   Maintain a healthy weight. The body mass index (BMI) is a screening tool to identify possible weight problems. It provides an estimate of body fat based on height and weight. Your caregiver can help determine your BMI, and can help you achieve or maintain a healthy weight. For adults 20 years and older:  A BMI below 18.5 is considered underweight.  A BMI of 18.5 to 24.9 is normal.  A BMI of 25 to 29.9 is considered overweight.  A BMI of 30 and above is considered obese.  Maintain normal blood lipids and cholesterol by exercising and minimizing your intake of saturated fat. Eat a balanced diet with plenty of fruits and vegetables. Blood tests for lipids and cholesterol should begin at age 20 and be repeated every 5 years. If your lipid or cholesterol levels are high, you are over  50, or you are a high risk for heart disease, you may need your cholesterol levels checked more frequently.Ongoing high lipid and cholesterol levels should be treated with medicines if diet and exercise are not effective.  If you smoke, find out from your caregiver how to quit. If you do not use tobacco, do not start.  Lung cancer screening is recommended for adults aged 55 80 years who are at high risk for developing lung cancer because of a history of smoking. Yearly low-dose computed tomography (CT) is recommended for people who have at least a 30-pack-year history of smoking and are a current smoker or have quit within the past 15 years. A pack year of smoking is smoking an average of 1 pack of cigarettes a day for 1 year (for example: 1 pack a day for 30 years or 2 packs a day for 15 years). Yearly screening should continue until the smoker has stopped smoking for at least 15 years. Yearly screening should also be stopped for people who develop a health problem that would prevent them from having lung cancer treatment.  If you are pregnant, do not drink alcohol. If you are breastfeeding, be very cautious about drinking alcohol. If you are not pregnant and choose to drink alcohol, do not exceed 1 drink per day. One drink is considered to be 12 ounces (355 mL) of beer, 5 ounces (148 mL) of wine, or 1.5 ounces (44 mL) of liquor.  Avoid use of street drugs. Do not share needles with anyone. Ask for help   if you need support or instructions about stopping the use of drugs.  High blood pressure causes heart disease and increases the risk of stroke. Blood pressure should be checked at least every 1 to 2 years. Ongoing high blood pressure should be treated with medicines, if weight loss and exercise are not effective.  If you are 55 to 62 years old, ask your caregiver if you should take aspirin to prevent strokes.  Diabetes screening involves taking a blood sample to check your fasting blood sugar level.  This should be done once every 3 years, after age 45, if you are within normal weight and without risk factors for diabetes. Testing should be considered at a younger age or be carried out more frequently if you are overweight and have at least 1 risk factor for diabetes.  Breast cancer screening is essential preventative care for women. You should practice "breast self-awareness." This means understanding the normal appearance and feel of your breasts and may include breast self-examination. Any changes detected, no matter how small, should be reported to a caregiver. Women in their 20s and 30s should have a clinical breast exam (CBE) by a caregiver as part of a regular health exam every 1 to 3 years. After age 40, women should have a CBE every year. Starting at age 40, women should consider having a mammogram (breast X-ray) every year. Women who have a family history of breast cancer should talk to their caregiver about genetic screening. Women at a high risk of breast cancer should talk to their caregiver about having an MRI and a mammogram every year.  Breast cancer gene (BRCA)-related cancer risk assessment is recommended for women who have family members with BRCA-related cancers. BRCA-related cancers include breast, ovarian, tubal, and peritoneal cancers. Having family members with these cancers may be associated with an increased risk for harmful changes (mutations) in the breast cancer genes BRCA1 and BRCA2. Results of the assessment will determine the need for genetic counseling and BRCA1 and BRCA2 testing.  The Pap test is a screening test for cervical cancer. Women should have a Pap test starting at age 21. Between ages 21 and 29, Pap tests should be repeated every 2 years. Beginning at age 30, you should have a Pap test every 3 years as long as the past 3 Pap tests have been normal. If you had a hysterectomy for a problem that was not cancer or a condition that could lead to cancer, then you no  longer need Pap tests. If you are between ages 65 and 70, and you have had normal Pap tests going back 10 years, you no longer need Pap tests. If you have had past treatment for cervical cancer or a condition that could lead to cancer, you need Pap tests and screening for cancer for at least 20 years after your treatment. If Pap tests have been discontinued, risk factors (such as a new sexual partner) need to be reassessed to determine if screening should be resumed. Some women have medical problems that increase the chance of getting cervical cancer. In these cases, your caregiver may recommend more frequent screening and Pap tests.  The human papillomavirus (HPV) test is an additional test that may be used for cervical cancer screening. The HPV test looks for the virus that can cause the cell changes on the cervix. The cells collected during the Pap test can be tested for HPV. The HPV test could be used to screen women aged 30 years and older, and   should be used in women of any age who have unclear Pap test results. After the age of 30, women should have HPV testing at the same frequency as a Pap test.  Colorectal cancer can be detected and often prevented. Most routine colorectal cancer screening begins at the age of 50 and continues through age 75. However, your caregiver may recommend screening at an earlier age if you have risk factors for colon cancer. On a yearly basis, your caregiver may provide home test kits to check for hidden blood in the stool. Use of a small camera at the end of a tube, to directly examine the colon (sigmoidoscopy or colonoscopy), can detect the earliest forms of colorectal cancer. Talk to your caregiver about this at age 50, when routine screening begins. Direct examination of the colon should be repeated every 5 to 10 years through age 75, unless early forms of pre-cancerous polyps or small growths are found.  Hepatitis C blood testing is recommended for all people born from  1945 through 1965 and any individual with known risks for hepatitis C.  Practice safe sex. Use condoms and avoid high-risk sexual practices to reduce the spread of sexually transmitted infections (STIs). Sexually active women aged 25 and younger should be checked for Chlamydia, which is a common sexually transmitted infection. Older women with new or multiple partners should also be tested for Chlamydia. Testing for other STIs is recommended if you are sexually active and at increased risk.  Osteoporosis is a disease in which the bones lose minerals and strength with aging. This can result in serious bone fractures. The risk of osteoporosis can be identified using a bone density scan. Women ages 65 and over and women at risk for fractures or osteoporosis should discuss screening with their caregivers. Ask your caregiver whether you should be taking a calcium supplement or vitamin D to reduce the rate of osteoporosis.  Menopause can be associated with physical symptoms and risks. Hormone replacement therapy is available to decrease symptoms and risks. You should talk to your caregiver about whether hormone replacement therapy is right for you.  Use sunscreen. Apply sunscreen liberally and repeatedly throughout the day. You should seek shade when your shadow is shorter than you. Protect yourself by wearing long sleeves, pants, a wide-brimmed hat, and sunglasses year round, whenever you are outdoors.  Notify your caregiver of new moles or changes in moles, especially if there is a change in shape or color. Also notify your caregiver if a mole is larger than the size of a pencil eraser.  Stay current with your immunizations. Document Released: 02/12/2011 Document Revised: 11/24/2012 Document Reviewed: 02/12/2011 [employer] Patient Information 2014 [employer].   

## 2014-02-10 NOTE — Addendum Note (Signed)
Addended by: Nelva Nay on: 02/10/2014 12:27 PM   Modules accepted: Orders

## 2014-02-11 LAB — URINALYSIS W MICROSCOPIC + REFLEX CULTURE
Bacteria, UA: NONE SEEN
Bilirubin Urine: NEGATIVE
Casts: NONE SEEN
Crystals: NONE SEEN
GLUCOSE, UA: NEGATIVE mg/dL
HGB URINE DIPSTICK: NEGATIVE
Ketones, ur: NEGATIVE mg/dL
LEUKOCYTES UA: NEGATIVE
Nitrite: NEGATIVE
PROTEIN: NEGATIVE mg/dL
Specific Gravity, Urine: 1.017 (ref 1.005–1.030)
UROBILINOGEN UA: 0.2 mg/dL (ref 0.0–1.0)
pH: 7.5 (ref 5.0–8.0)

## 2014-02-11 LAB — CYTOLOGY - PAP

## 2014-03-02 ENCOUNTER — Ambulatory Visit (INDEPENDENT_AMBULATORY_CARE_PROVIDER_SITE_OTHER): Payer: BC Managed Care – PPO

## 2014-03-02 DIAGNOSIS — M899 Disorder of bone, unspecified: Secondary | ICD-10-CM

## 2014-03-02 DIAGNOSIS — M8580 Other specified disorders of bone density and structure, unspecified site: Secondary | ICD-10-CM

## 2014-03-02 DIAGNOSIS — M949 Disorder of cartilage, unspecified: Secondary | ICD-10-CM

## 2014-03-03 ENCOUNTER — Telehealth: Payer: Self-pay | Admitting: Gynecology

## 2014-03-03 ENCOUNTER — Encounter: Payer: Self-pay | Admitting: Gynecology

## 2014-03-03 DIAGNOSIS — M8580 Other specified disorders of bone density and structure, unspecified site: Secondary | ICD-10-CM

## 2014-03-03 DIAGNOSIS — M898X9 Other specified disorders of bone, unspecified site: Secondary | ICD-10-CM

## 2014-03-03 NOTE — Telephone Encounter (Signed)
Tell patient that her bone density does show some bone loss her prior study but still within the osteopenic range. Recommend checking a vitamin D level just to make sure that she is in the normal range as this is something we could modify if low but otherwise monitoring at present and repeating it in 2 years.

## 2014-03-04 NOTE — Telephone Encounter (Signed)
LEFT MESSAGE FOR PT TO CALL.

## 2014-03-11 NOTE — Addendum Note (Signed)
Addended by: Thamas Jaegers on: 03/11/2014 03:55 PM   Modules accepted: Orders

## 2014-03-11 NOTE — Telephone Encounter (Signed)
Pt informed, rx sent 

## 2014-03-12 ENCOUNTER — Other Ambulatory Visit: Payer: BC Managed Care – PPO

## 2014-03-12 DIAGNOSIS — M898X9 Other specified disorders of bone, unspecified site: Secondary | ICD-10-CM

## 2014-03-12 DIAGNOSIS — M8580 Other specified disorders of bone density and structure, unspecified site: Secondary | ICD-10-CM

## 2014-03-13 LAB — VITAMIN D 25 HYDROXY (VIT D DEFICIENCY, FRACTURES): VIT D 25 HYDROXY: 72 ng/mL (ref 30–89)

## 2014-06-14 ENCOUNTER — Encounter: Payer: Self-pay | Admitting: Gynecology

## 2014-09-29 ENCOUNTER — Other Ambulatory Visit: Payer: Self-pay

## 2014-09-29 DIAGNOSIS — Z1231 Encounter for screening mammogram for malignant neoplasm of breast: Secondary | ICD-10-CM

## 2014-10-29 ENCOUNTER — Ambulatory Visit
Admission: RE | Admit: 2014-10-29 | Discharge: 2014-10-29 | Disposition: A | Discharge status: Home / Self Care | Payer: BLUE CROSS/BLUE SHIELD | Source: Ambulatory Visit

## 2014-10-29 DIAGNOSIS — Z1231 Encounter for screening mammogram for malignant neoplasm of breast: Secondary | ICD-10-CM

## 2015-01-03 ENCOUNTER — Emergency Department (HOSPITAL_COMMUNITY)
Admission: EM | Admit: 2015-01-03 | Discharge: 2015-01-03 | Disposition: A | Discharge status: Home / Self Care | Payer: BLUE CROSS/BLUE SHIELD | Attending: Emergency Medicine | Admitting: Emergency Medicine

## 2015-01-03 ENCOUNTER — Encounter (HOSPITAL_COMMUNITY): Payer: Self-pay | Admitting: Radiology

## 2015-01-03 ENCOUNTER — Emergency Department (HOSPITAL_COMMUNITY): Payer: BLUE CROSS/BLUE SHIELD

## 2015-01-03 DIAGNOSIS — Z8679 Personal history of other diseases of the circulatory system: Secondary | ICD-10-CM | POA: Diagnosis not present

## 2015-01-03 DIAGNOSIS — M8580 Other specified disorders of bone density and structure, unspecified site: Secondary | ICD-10-CM | POA: Diagnosis not present

## 2015-01-03 DIAGNOSIS — R55 Syncope and collapse: Secondary | ICD-10-CM | POA: Diagnosis not present

## 2015-01-03 DIAGNOSIS — Z7982 Long term (current) use of aspirin: Secondary | ICD-10-CM | POA: Insufficient documentation

## 2015-01-03 DIAGNOSIS — E780 Pure hypercholesterolemia: Secondary | ICD-10-CM | POA: Insufficient documentation

## 2015-01-03 DIAGNOSIS — Z79899 Other long term (current) drug therapy: Secondary | ICD-10-CM | POA: Diagnosis not present

## 2015-01-03 DIAGNOSIS — R109 Unspecified abdominal pain: Secondary | ICD-10-CM | POA: Insufficient documentation

## 2015-01-03 LAB — COMPREHENSIVE METABOLIC PANEL
ALT: 26 U/L (ref 14–54)
AST: 26 U/L (ref 15–41)
Albumin: 3.7 g/dL (ref 3.5–5.0)
Alkaline Phosphatase: 43 U/L (ref 38–126)
Anion gap: 6 (ref 5–15)
BUN: 19 mg/dL (ref 6–20)
CO2: 25 mmol/L (ref 22–32)
Calcium: 8.8 mg/dL — ABNORMAL LOW (ref 8.9–10.3)
Chloride: 106 mmol/L (ref 101–111)
Creatinine, Ser: 0.92 mg/dL (ref 0.44–1.00)
GFR calc Af Amer: >60 mL/min (ref >60)
GFR calc non Af Amer: >60 mL/min (ref >60)
Glucose, Bld: 106 mg/dL — ABNORMAL HIGH (ref 65–99)
Potassium: 3.8 mmol/L (ref 3.5–5.1)
Sodium: 137 mmol/L (ref 135–145)
Total Bilirubin: 0.4 mg/dL (ref 0.3–1.2)
Total Protein: 6.2 g/dL — ABNORMAL LOW (ref 6.5–8.1)

## 2015-01-03 LAB — CBC WITH DIFFERENTIAL/PLATELET
Basophils Absolute: 0 10*3/uL (ref 0.0–0.1)
Basophils Relative: 0 % (ref 0–1)
Eosinophils Absolute: 0.1 10*3/uL (ref 0.0–0.7)
Eosinophils Relative: 1 % (ref 0–5)
HCT: 36.9 % (ref 36.0–46.0)
Hemoglobin: 12.3 g/dL (ref 12.0–15.0)
Lymphocytes Relative: 6 % — ABNORMAL LOW (ref 12–46)
Lymphs Abs: 0.8 10*3/uL (ref 0.7–4.0)
MCH: 30.1 pg (ref 26.0–34.0)
MCHC: 33.3 g/dL (ref 30.0–36.0)
MCV: 90.4 fL (ref 78.0–100.0)
Monocytes Absolute: 0.7 10*3/uL (ref 0.1–1.0)
Monocytes Relative: 5 % (ref 3–12)
Neutro Abs: 10.8 10*3/uL — ABNORMAL HIGH (ref 1.7–7.7)
Neutrophils Relative %: 88 % — ABNORMAL HIGH (ref 43–77)
Platelets: 178 10*3/uL (ref 150–400)
RBC: 4.08 MIL/uL (ref 3.87–5.11)
RDW: 12.1 % (ref 11.5–15.5)
WBC: 12.3 10*3/uL — ABNORMAL HIGH (ref 4.0–10.5)

## 2015-01-03 LAB — URINALYSIS, ROUTINE W REFLEX MICROSCOPIC
Bilirubin Urine: NEGATIVE
Glucose, UA: NEGATIVE mg/dL
Hgb urine dipstick: NEGATIVE
Ketones, ur: NEGATIVE mg/dL
Leukocytes, UA: NEGATIVE
Nitrite: NEGATIVE
Protein, ur: NEGATIVE mg/dL
Specific Gravity, Urine: 1.021 (ref 1.005–1.030)
Urobilinogen, UA: 0.2 mg/dL (ref 0.0–1.0)
pH: 7 (ref 5.0–8.0)

## 2015-01-03 LAB — I-STAT TROPONIN, ED
TROPONIN I, POC: 0 ng/mL (ref 0.00–0.08)
Troponin i, poc: 0 ng/mL (ref 0.00–0.08)

## 2015-01-03 LAB — D-DIMER, QUANTITATIVE: D-Dimer, Quant: <0.27 ug/mL-FEU (ref 0.00–0.48)

## 2015-01-03 MED ORDER — ONDANSETRON HCL 4 MG/2ML IJ SOLN
4.0000 mg | Freq: Once | INTRAMUSCULAR | Status: AC
  Administered 2015-01-03: 4 mg via INTRAVENOUS
  Filled 2015-01-03: qty 2

## 2015-01-03 MED ORDER — ONDANSETRON HCL 4 MG PO TABS: 4.0000 mg | ORAL_TABLET | Freq: Four times a day (QID) | ORAL | Status: DC

## 2015-01-03 NOTE — ED Notes (Signed)
Pt presents with near syncopal episode while sitting having lunch at [employer]. Pt states that she "felt like her blood pressure was dropping" Pt preceded to get up from the table and walked away. Pt states that she was diaphoretic with "lower Chest tightness" Pt states she was [facility] and laid down on the floor. Condition is acute in nature. Condition is made better by laying down on her stomach. Pt describes that pain at this time as intermittent epigastric like she has to "go to the bathroom"

## 2015-01-03 NOTE — ED Provider Notes (Signed)
CSN: [id number]     Arrival date & time 01/03/15  1358 History   First MD Initiated Contact with Patient 01/03/15 1402     Chief Complaint  Patient presents with  . Near Syncope    HPI   64 year old female presents with near syncope. Patient reports she was eating lunch today when she started feeling ill. She reports that she walked away from the table became dizzy with associated abdominal cramping, felt as if she was going to pass out so she laid down on the floor; no loss of consciousness or chest pain, but did note shortness of breath at that time. Patient reports that after lying on the floor for couple minutes she began to feel better. No longer dizzy. 911 was called, she was given 400 L of fluid in route. She describes the abdominal cramping has a bowel urgency feeling as if she needs to use the restroom but cannot. She denies headache, shortness of breath, chest pain, upper abdominal pain, diarrhea, lower extremity swelling or edema. Pt denies close sick contacts, exposure to abnormal food or drink, drug or alcohol use, abdominal trauma. She reports a past medical history including 2 cesarean sections approximately 20 years ago. She denies any changes in her bowel habits including dark red bowel movements. EMS reports CBG of 116.    Past Medical History  Diagnosis Date  . Osteopenia 02/2014    T score -2.0 FRAX 8%/0.7% noting past history of Fosamax  . Elevated cholesterol   . PVC (premature ventricular contraction)    Past Surgical History  Procedure Laterality Date  . Cesarean section    . Hysteroscopy    . Dilation and curettage of uterus    . Elbow surgery    . Shoulder surgery     Family History  Problem Relation Age of Onset  . Heart disease Mother   . Lung cancer Mother   . Heart disease Father   . Hypertension Father   . Kidney cancer Father   . Heart disease Maternal Grandmother   . Heart disease Maternal Grandfather   . Diabetes Paternal Grandmother    History   Substance Use Topics  . Smoking status: Never Smoker   . Smokeless tobacco: Not on file  . Alcohol Use: 2.5 oz/week    5 drink(s) per week     Comment: glass of wine at night 3-4 x week   OB History    Gravida Para Term Preterm AB TAB SAB Ectopic Multiple Living   5 2 2  3     2      Review of Systems  All other systems reviewed and are negative.   Allergies  Review of patient's allergies indicates no known allergies.  Home Medications   Prior to Admission medications   Medication Sig Start Date End Date Taking? Authorizing Provider  aspirin 81 MG tablet Take 81 mg by mouth daily.    Historical Provider, MD  buPROPion (WELLBUTRIN XL) 300 MG 24 hr tablet Take 300 mg by mouth daily.    Historical Provider, MD  Calcium Carbonate-Vitamin D (CALCIUM + D PO) Take by mouth.    Historical Provider, MD  metoprolol succinate (TOPROL-XL) 100 MG 24 hr tablet Take 100 mg by mouth daily. Take with or immediately following a meal.    Historical Provider, MD  NONFORMULARY OR COMPOUNDED ITEM Testosterone 2% cream 30 Gm. S: apply hs as directed. 02/10/14   Anastasio Auerbach, MD  NONFORMULARY OR COMPOUNDED ITEM Estradiol 0.02%  Cream 45 ml. S:  Apply 1 ml. Vaginally hs 3 x weekly as directed. 02/10/14   Anastasio Auerbach, MD  simvastatin (ZOCOR) 40 MG tablet Take 40 mg by mouth every evening.    Historical Provider, MD   BP 109/55 mmHg  Pulse 63  Temp(Src) 97.7 F (36.5 C) (Oral)  Resp 13  SpO2 100% Physical Exam  Constitutional: She is oriented to person, place, and time. She appears well-developed and well-nourished.  HENT:  Head: Normocephalic and atraumatic.  Eyes: Pupils are equal, round, and reactive to light.  Neck: Normal range of motion. Neck supple. No JVD present. No tracheal deviation present. No thyromegaly present.  Cardiovascular: Normal rate, regular rhythm, normal heart sounds and intact distal pulses.  Exam reveals no gallop and no friction rub.   No murmur  heard. Pulmonary/Chest: Effort normal and breath sounds normal. No stridor. No respiratory distress. She has no wheezes. She has no rales. She exhibits no tenderness.  Musculoskeletal: Normal range of motion.  No lower extremity swelling or edema  Lymphadenopathy:    She has no cervical adenopathy.  Neurological: She is alert and oriented to person, place, and time. Coordination normal.  Skin: Skin is warm and dry.  Psychiatric: She has a normal mood and affect. Her behavior is normal. Judgment and thought content normal.  Nursing note and vitals reviewed.   ED Course  Procedures (including critical care time) Labs Review Labs Reviewed  CBC WITH DIFFERENTIAL/PLATELET  COMPREHENSIVE METABOLIC PANEL    Imaging Review No results found.   EKG Interpretation   Date/Time:  Monday Jan 03 2015 14:05:49 EDT Ventricular Rate:  67 PR Interval:  157 QRS Duration: 85 QT Interval:  406 QTC Calculation: 429 R Axis:   34 Text Interpretation:  Sinus rhythm Low voltage, precordial leads Baseline  wander No significant change since last tracing Confirmed by Maryan Rued  MD,  Loree Fee ([id number]) on 01/03/2015 2:47:16 PM      MDM   Final diagnoses:  Near syncope    Labs: CBC, CMP, urinalysis, troponin x2, d-dimer- no significant findings  Imaging: DG chest 2 view- no significant findings  Consults: None  Therapeutics: Zofran, normal saline  Assessment: Near syncope  Plan: Patient presents with near syncope and abdominal cramping. She reports syncopal symptoms resolved rather quickly, before EMS arrival. Continue to have a listless sensation, but denied chest pain, continued to deny shortness of breath, focal abdominal pain, vomiting, diarrhea, or lower extremity swelling or edema. Patient was afebrile with normal vital signs, above laboratory and diagnostic imaging showed no significant findings. Patient reported that her abdominal cramping was improving throughout her ED stay, she was  given Zofran as needed for nausea, and was resting peacefully before discharge. This is unlikely ACS as her heart scores 3 with 2 negative troponins. D-dimer negative unlikely PE. Nonfocal abdominal exam makes the need for acute diagnostic imaging unnecessary at this time. This could likely represent beginning in the viral gastroenteritis with potential vasovagal. Neuro exam normal. Patient is instructed to use Zofran as needed for the nausea, bland diet, drink plenty of fluids, rest, and monitor for new or worsening signs or symptoms. Given strict return cautioned the event new worsening signs or symptoms present. Husband and son were present at the time of evaluation and discharge, the verbalized her understanding to today's evaluation plan, and agreed for follow-up evaluation if necessary. Patient had no further questions or concerns at the time of discharge.      Okey Regal, PA-C  01/06/15 [place], MD 01/06/15 2108

## 2015-01-03 NOTE — Discharge Instructions (Signed)
Near-Syncope Near-syncope (commonly known as near fainting) is sudden weakness, dizziness, or feeling like you might pass out. During an episode of near-syncope, you may also develop pale skin, have tunnel vision, or feel sick to your stomach (nauseous). Near-syncope may occur when getting up after sitting or while standing for a long time. It is caused by a sudden decrease in blood flow to the brain. This decrease can result from various causes or triggers, most of which are not serious. However, because near-syncope can sometimes be a sign of something serious, a medical evaluation is required. The specific cause is often not determined. HOME CARE INSTRUCTIONS  Monitor your condition for any changes. The following actions may help to alleviate any discomfort you are experiencing:  Have someone stay with you until you feel stable.  Lie down right away and prop your feet up if you start feeling like you might faint. Breathe deeply and steadily. Wait until all the symptoms have passed. Most of these episodes last only a few minutes. You may feel tired for several hours.   Drink enough fluids to keep your urine clear or pale yellow.   If you are taking blood pressure or heart medicine, get up slowly when seated or lying down. Take several minutes to sit and then stand. This can reduce dizziness.  Follow up with your health care provider as directed. SEEK IMMEDIATE MEDICAL CARE IF:   You have a severe headache.   You have unusual pain in the chest, abdomen, or back.   You are bleeding from the mouth or rectum, or you have black or tarry stool.   You have an irregular or very fast heartbeat.   You have repeated fainting or have seizure-like jerking during an episode.   You faint when sitting or lying down.   You have confusion.   You have difficulty walking.   You have severe weakness.   You have vision problems.  MAKE SURE YOU:   Understand these instructions.  Will  watch your condition.  Will get help right away if you are not doing well or get worse. Document Released: 07/30/2005 Document Revised: 08/04/2013 Document Reviewed: 01/02/2013 [facility] Patient Information 2015 [place], [place]. This information is not intended to replace advice given to you by your health care provider. Make sure you discuss any questions you have with your health care provider.  Please read the attached information. Please monitor for new or worsening signs or symptoms, please follow-up immediately if any present. Please contact your primary care provider in 3 days if symptoms do not improve. Please use Zofran as needed for nausea, please follow bland diet including bananas, rice, apples, toast.

## 2015-03-09 ENCOUNTER — Encounter: Payer: Self-pay | Admitting: Gynecology

## 2015-03-16 ENCOUNTER — Encounter: Payer: Self-pay | Admitting: Gynecology

## 2015-03-16 ENCOUNTER — Ambulatory Visit (INDEPENDENT_AMBULATORY_CARE_PROVIDER_SITE_OTHER): Payer: BLUE CROSS/BLUE SHIELD | Admitting: Gynecology

## 2015-03-16 VITALS — BP 116/72 | Ht 61.5 in | Wt 135.0 lb

## 2015-03-16 DIAGNOSIS — N952 Postmenopausal atrophic vaginitis: Secondary | ICD-10-CM

## 2015-03-16 DIAGNOSIS — Z01419 Encounter for gynecological examination (general) (routine) without abnormal findings: Secondary | ICD-10-CM

## 2015-03-16 DIAGNOSIS — M8580 Other specified disorders of bone density and structure, unspecified site: Secondary | ICD-10-CM

## 2015-03-16 LAB — URINALYSIS W MICROSCOPIC + REFLEX CULTURE
BILIRUBIN URINE: NEGATIVE
CRYSTALS: NONE SEEN [HPF]
Casts: NONE SEEN [LPF]
Glucose, UA: NEGATIVE
KETONES UR: NEGATIVE
LEUKOCYTES UA: NEGATIVE
Nitrite: NEGATIVE
PH: 5.5 (ref 5.0–8.0)
PROTEIN: NEGATIVE
Specific Gravity, Urine: 1.015 (ref 1.001–1.035)
YEAST: NONE SEEN [HPF]

## 2015-03-16 NOTE — Patient Instructions (Signed)
You may obtain a copy of any labs that were done today by logging onto MyChart as outlined in the instructions provided with your AVS (after visit summary). The office will not call with normal lab results but certainly if there are any significant abnormalities then we will contact you.   Health Maintenance, Female A healthy lifestyle and preventative care can promote health and wellness.  Maintain regular health, dental, and eye exams.  Eat a healthy diet. Foods like vegetables, fruits, whole grains, low-fat dairy products, and lean protein foods contain the nutrients you need without too many calories. Decrease your intake of foods high in solid fats, added sugars, and salt. Get information about a proper diet from your caregiver, if necessary.  Regular physical exercise is one of the most important things you can do for your health. Most adults should get at least 150 minutes of moderate-intensity exercise (any activity that increases your heart rate and causes you to sweat) each week. In addition, most adults need muscle-strengthening exercises on 2 or more days a week.   Maintain a healthy weight. The body mass index (BMI) is a screening tool to identify possible weight problems. It provides an estimate of body fat based on height and weight. Your caregiver can help determine your BMI, and can help you achieve or maintain a healthy weight. For adults 20 years and older:  A BMI below 18.5 is considered underweight.  A BMI of 18.5 to 24.9 is normal.  A BMI of 25 to 29.9 is considered overweight.  A BMI of 30 and above is considered obese.  Maintain normal blood lipids and cholesterol by exercising and minimizing your intake of saturated fat. Eat a balanced diet with plenty of fruits and vegetables. Blood tests for lipids and cholesterol should begin at age 61 and be repeated every 5 years. If your lipid or cholesterol levels are high, you are over 50, or you are a high risk for heart  disease, you may need your cholesterol levels checked more frequently.Ongoing high lipid and cholesterol levels should be treated with medicines if diet and exercise are not effective.  If you smoke, find out from your caregiver how to quit. If you do not use tobacco, do not start.  Lung cancer screening is recommended for adults aged 33 80 years who are at high risk for developing lung cancer because of a history of smoking. Yearly low-dose computed tomography (CT) is recommended for people who have at least a 30-pack-year history of smoking and are a current smoker or have quit within the past 15 years. A pack year of smoking is smoking an average of 1 pack of cigarettes a day for 1 year (for example: 1 pack a day for 30 years or 2 packs a day for 15 years). Yearly screening should continue until the smoker has stopped smoking for at least 15 years. Yearly screening should also be stopped for people who develop a health problem that would prevent them from having lung cancer treatment.  If you are pregnant, do not drink alcohol. If you are breastfeeding, be very cautious about drinking alcohol. If you are not pregnant and choose to drink alcohol, do not exceed 1 drink per day. One drink is considered to be 12 ounces (355 mL) of beer, 5 ounces (148 mL) of wine, or 1.5 ounces (44 mL) of liquor.  Avoid use of street drugs. Do not share needles with anyone. Ask for help if you need support or instructions about stopping  the use of drugs.  High blood pressure causes heart disease and increases the risk of stroke. Blood pressure should be checked at least every 1 to 2 years. Ongoing high blood pressure should be treated with medicines, if weight loss and exercise are not effective.  If you are 59 to 64 years old, ask your caregiver if you should take aspirin to prevent strokes.  Diabetes screening involves taking a blood sample to check your fasting blood sugar level. This should be done once every 3  years, after age 91, if you are within normal weight and without risk factors for diabetes. Testing should be considered at a younger age or be carried out more frequently if you are overweight and have at least 1 risk factor for diabetes.  Breast cancer screening is essential preventative care for women. You should practice "breast self-awareness." This means understanding the normal appearance and feel of your breasts and may include breast self-examination. Any changes detected, no matter how small, should be reported to a caregiver. Women in their 66s and 30s should have a clinical breast exam (CBE) by a caregiver as part of a regular health exam every 1 to 3 years. After age 101, women should have a CBE every year. Starting at age 100, women should consider having a mammogram (breast X-ray) every year. Women who have a family history of breast cancer should talk to their caregiver about genetic screening. Women at a high risk of breast cancer should talk to their caregiver about having an MRI and a mammogram every year.  Breast cancer gene (BRCA)-related cancer risk assessment is recommended for women who have family members with BRCA-related cancers. BRCA-related cancers include breast, ovarian, tubal, and peritoneal cancers. Having family members with these cancers may be associated with an increased risk for harmful changes (mutations) in the breast cancer genes BRCA1 and BRCA2. Results of the assessment will determine the need for genetic counseling and BRCA1 and BRCA2 testing.  The Pap test is a screening test for cervical cancer. Women should have a Pap test starting at age 57. Between ages 25 and 35, Pap tests should be repeated every 2 years. Beginning at age 37, you should have a Pap test every 3 years as long as the past 3 Pap tests have been normal. If you had a hysterectomy for a problem that was not cancer or a condition that could lead to cancer, then you no longer need Pap tests. If you are  between ages 50 and 76, and you have had normal Pap tests going back 10 years, you no longer need Pap tests. If you have had past treatment for cervical cancer or a condition that could lead to cancer, you need Pap tests and screening for cancer for at least 20 years after your treatment. If Pap tests have been discontinued, risk factors (such as a new sexual partner) need to be reassessed to determine if screening should be resumed. Some women have medical problems that increase the chance of getting cervical cancer. In these cases, your caregiver may recommend more frequent screening and Pap tests.  The human papillomavirus (HPV) test is an additional test that may be used for cervical cancer screening. The HPV test looks for the virus that can cause the cell changes on the cervix. The cells collected during the Pap test can be tested for HPV. The HPV test could be used to screen women aged 44 years and older, and should be used in women of any age  who have unclear Pap test results. After the age of 55, women should have HPV testing at the same frequency as a Pap test.  Colorectal cancer can be detected and often prevented. Most routine colorectal cancer screening begins at the age of 44 and continues through age 20. However, your caregiver may recommend screening at an earlier age if you have risk factors for colon cancer. On a yearly basis, your caregiver may provide home test kits to check for hidden blood in the stool. Use of a small camera at the end of a tube, to directly examine the colon (sigmoidoscopy or colonoscopy), can detect the earliest forms of colorectal cancer. Talk to your caregiver about this at age 86, when routine screening begins. Direct examination of the colon should be repeated every 5 to 10 years through age 13, unless early forms of pre-cancerous polyps or small growths are found.  Hepatitis C blood testing is recommended for all people born from [birth year] through 1964 and any  individual with known risks for hepatitis C.  Practice safe sex. Use condoms and avoid high-risk sexual practices to reduce the spread of sexually transmitted infections (STIs). Sexually active women aged 36 and younger should be checked for Chlamydia, which is a common sexually transmitted infection. Older women with new or multiple partners should also be tested for Chlamydia. Testing for other STIs is recommended if you are sexually active and at increased risk.  Osteoporosis is a disease in which the bones lose minerals and strength with aging. This can result in serious bone fractures. The risk of osteoporosis can be identified using a bone density scan. Women ages 20 and over and women at risk for fractures or osteoporosis should discuss screening with their caregivers. Ask your caregiver whether you should be taking a calcium supplement or vitamin D to reduce the rate of osteoporosis.  Menopause can be associated with physical symptoms and risks. Hormone replacement therapy is available to decrease symptoms and risks. You should talk to your caregiver about whether hormone replacement therapy is right for you.  Use sunscreen. Apply sunscreen liberally and repeatedly throughout the day. You should seek shade when your shadow is shorter than you. Protect yourself by wearing long sleeves, pants, a wide-brimmed hat, and sunglasses year round, whenever you are outdoors.  Notify your caregiver of new moles or changes in moles, especially if there is a change in shape or color. Also notify your caregiver if a mole is larger than the size of a pencil eraser.  Stay current with your immunizations. Document Released: 02/12/2011 Document Revised: 11/24/2012 Document Reviewed: 02/12/2011 [facility] Patient Information 2014 [employer].

## 2015-03-16 NOTE — Progress Notes (Signed)
Kayla Cannon 1950-12-18 [id number]        65 y.o.  [id number] for annual exam.  Doing well without complaints.  Past medical history,surgical history, problem list, medications, allergies, family history and social history were all reviewed and documented as reviewed in the EPIC chart.  ROS:  Performed with pertinent positives and negatives included in the history, assessment and plan.   Additional significant findings :  none   Exam: [name] Vitals:   03/16/15 1516  BP: 116/72  Height: 5' 1.5" (1.562 m)  Weight: 135 lb (61.236 kg)   General appearance:  Normal affect, orientation and appearance. Skin: Grossly normal HEENT: Without gross lesions.  No cervical or supraclavicular adenopathy. Thyroid normal.  Lungs:  Clear without wheezing, rales or rhonchi Cardiac: RR, without RMG Abdominal:  Soft, nontender, without masses, guarding, rebound, organomegaly or hernia Breasts:  Examined lying and sitting without masses, retractions, discharge or axillary adenopathy. Pelvic:  Ext/BUS/vagina with atrophic changes  Cervix with atrophic changes  Uterus anteverted, normal size, shape and contour, midline and mobile nontender   Adnexa  Without masses or tenderness    Anus and perineum  Normal   Rectovaginal  Normal sphincter tone without palpated masses or tenderness.    Assessment/Plan:  64 y.o. [id number] female for annual exam.   1. Postmenopausal/atrophic genital changes. Patient using estradiol 0.02% cream intermittently. Admits to not using it as regularly as she has been. She does feel though that it helps when used. We again discussed the absorption issue and risks of systemic effects such as thrombosis breast cancer and endometrial stimulation. She is comfortable continuing it and will refill 1 year. 2. Decreased libido. Patient using 2% testosterone cream applied periclitoraly twice weekly. Feels she is getting a good response from this and wants to continue. We again  discussed absorption risks as in the past. Refill 1 year provided. 3. Osteopenia. DEXA 02/2014 T score -2.0. She did have statistically significant decline it measured sites. Had been on Fosamax for approximately 6 years. The issues of whether to treat this or observe at present discussed. We both agree at this point just to watch it and repeat her DEXA next year to year interval. Vitamin D level last year was 70. Not repeated this year. 4. Mammography 10/2014. Continue with annual mammography. SBE monthly reviewed. 5. Pap smear/HPV negative 2015. No Pap smear done today. Plan repeat Pap smear in 3-5 year interval per current screening guidelines.  6. Colonoscopy 2014. Repeat at their recommended interval. 7. Health maintenance. No routine blood work done as this is done at her primary physician's office. Follow up one year, sooner as needed.   Anastasio Auerbach MD, 4:21 PM 03/16/2015

## 2015-03-17 ENCOUNTER — Telehealth: Payer: Self-pay | Admitting: *Deleted

## 2015-03-17 LAB — URINE CULTURE
Colony Count: NO GROWTH
ORGANISM ID, BACTERIA: NO GROWTH

## 2015-03-17 MED ORDER — NONFORMULARY OR COMPOUNDED ITEM: Status: AC

## 2015-03-17 NOTE — Telephone Encounter (Signed)
Rx called in 

## 2015-03-17 NOTE — Telephone Encounter (Signed)
-----   Message from Anastasio Auerbach, MD sent at 03/16/2015  4:26 PM EDT ----- Call in refills at [facility] for the patient's estradiol 0.02% vaginal cream and her testosterone 2% cream.

## 2015-05-24 ENCOUNTER — Other Ambulatory Visit: Payer: Self-pay | Admitting: Neurosurgery

## 2015-05-24 DIAGNOSIS — M5020 Other cervical disc displacement, unspecified cervical region: Secondary | ICD-10-CM

## 2015-05-25 ENCOUNTER — Ambulatory Visit
Admission: RE | Admit: 2015-05-25 | Discharge: 2015-05-25 | Disposition: A | Discharge status: Home / Self Care | Payer: BLUE CROSS/BLUE SHIELD | Source: Ambulatory Visit | Attending: Neurosurgery | Admitting: Neurosurgery

## 2015-05-25 DIAGNOSIS — M5020 Other cervical disc displacement, unspecified cervical region: Secondary | ICD-10-CM

## 2015-05-25 MED ORDER — IOHEXOL 300 MG/ML  SOLN
1.0000 mL | Freq: Once | INTRAMUSCULAR | Status: DC | PRN
  Administered 2015-05-25: 1 mL via EPIDURAL

## 2015-05-25 MED ORDER — TRIAMCINOLONE ACETONIDE 40 MG/ML IJ SUSP (RADIOLOGY)
60.0000 mg | Freq: Once | INTRAMUSCULAR | Status: AC
  Administered 2015-05-25: 60 mg via EPIDURAL

## 2015-05-25 NOTE — Discharge Instructions (Addendum)

## 2015-06-30 ENCOUNTER — Other Ambulatory Visit: Payer: Self-pay | Admitting: Neurosurgery

## 2015-06-30 DIAGNOSIS — M4722 Other spondylosis with radiculopathy, cervical region: Secondary | ICD-10-CM

## 2015-07-12 ENCOUNTER — Ambulatory Visit
Admission: RE | Admit: 2015-07-12 | Discharge: 2015-07-12 | Disposition: A | Discharge status: Home / Self Care | Payer: BLUE CROSS/BLUE SHIELD | Source: Ambulatory Visit | Attending: Neurosurgery | Admitting: Neurosurgery

## 2015-07-12 DIAGNOSIS — M4722 Other spondylosis with radiculopathy, cervical region: Secondary | ICD-10-CM

## 2015-07-12 MED ORDER — DIAZEPAM 5 MG PO TABS
5.0000 mg | ORAL_TABLET | Freq: Once | ORAL | Status: AC
Start: 2015-07-12 — End: 2015-07-12
  Administered 2015-07-12: 5 mg via ORAL

## 2015-07-12 MED ORDER — IOHEXOL 300 MG/ML  SOLN
10.0000 mL | Freq: Once | INTRAMUSCULAR | Status: AC | PRN
  Administered 2015-07-12: 10 mL via INTRATHECAL

## 2015-07-12 NOTE — Discharge Instructions (Signed)
Myelogram Discharge Instructions  1. Go home and rest quietly for the next 24 hours.  It is important to lie flat for the next 24 hours.  Get up only to go to the restroom.  You may lie in the bed or on a couch on your back, your stomach, your left side or your right side.  You may have one pillow under your head.  You may have pillows between your knees while you are on your side or under your knees while you are on your back.  2. DO NOT drive today.  Recline the seat as far back as it will go, while still wearing your seat belt, on the way home.  3. You may get up to go to the bathroom as needed.  You may sit up for 10 minutes to eat.  You may resume your normal diet and medications unless otherwise indicated.  Drink plenty of extra fluids today and tomorrow.  4. The incidence of a spinal headache with nausea and/or vomiting is about 5% (one in 20 patients).  If you develop a headache, lie flat and drink plenty of fluids until the headache goes away.  Caffeinated beverages may be helpful.  If you develop severe nausea and vomiting or a headache that does not go away with flat bed rest, call [phone].  5. You may resume normal activities after your 24 hours of bed rest is over; however, do not exert yourself strongly or do any heavy lifting tomorrow.  6. Call your physician for a follow-up appointment.   You may resume Phentermine and Wellbutrin on Wednesday, July 13, 2015 after 8:00a.m.

## 2015-07-12 NOTE — Progress Notes (Signed)
Patient states she has been off Phentermine and Wellbutrin for at least the past two days.  jkl

## 2015-09-20 ENCOUNTER — Other Ambulatory Visit: Payer: Self-pay

## 2015-09-20 DIAGNOSIS — Z1231 Encounter for screening mammogram for malignant neoplasm of breast: Secondary | ICD-10-CM

## 2015-11-08 ENCOUNTER — Ambulatory Visit
Admission: RE | Admit: 2015-11-08 | Discharge: 2015-11-08 | Disposition: A | Discharge status: Home / Self Care | Payer: BLUE CROSS/BLUE SHIELD | Source: Ambulatory Visit

## 2015-11-08 DIAGNOSIS — Z1231 Encounter for screening mammogram for malignant neoplasm of breast: Secondary | ICD-10-CM

## 2016-02-17 ENCOUNTER — Ambulatory Visit (INDEPENDENT_AMBULATORY_CARE_PROVIDER_SITE_OTHER): Payer: BLUE CROSS/BLUE SHIELD | Admitting: Gynecology

## 2016-02-17 ENCOUNTER — Encounter: Payer: Self-pay | Admitting: Gynecology

## 2016-02-17 VITALS — BP 126/80 | Ht 61.0 in | Wt 135.0 lb

## 2016-02-17 DIAGNOSIS — N898 Other specified noninflammatory disorders of vagina: Secondary | ICD-10-CM | POA: Diagnosis not present

## 2016-02-17 DIAGNOSIS — R300 Dysuria: Secondary | ICD-10-CM | POA: Diagnosis not present

## 2016-02-17 LAB — WET PREP FOR TRICH, YEAST, CLUE
CLUE CELLS WET PREP: NONE SEEN
Trich, Wet Prep: NONE SEEN

## 2016-02-17 LAB — URINALYSIS W MICROSCOPIC + REFLEX CULTURE
BACTERIA UA: NONE SEEN [HPF]
Bilirubin Urine: NEGATIVE
CASTS: NONE SEEN [LPF]
CRYSTALS: NONE SEEN [HPF]
Glucose, UA: NEGATIVE
Hgb urine dipstick: NEGATIVE
Ketones, ur: NEGATIVE
Leukocytes, UA: NEGATIVE
Nitrite: NEGATIVE
PROTEIN: NEGATIVE
RBC / HPF: NONE SEEN RBC/HPF (ref <=2)
Specific Gravity, Urine: 1.01 (ref 1.001–1.035)
WBC, UA: NONE SEEN WBC/HPF (ref <=5)
YEAST: NONE SEEN [HPF]
pH: 6.5 (ref 5.0–8.0)

## 2016-02-17 MED ORDER — FLUCONAZOLE 150 MG PO TABS: 150.0000 mg | ORAL_TABLET | ORAL | Status: DC

## 2016-02-17 NOTE — Patient Instructions (Signed)
Take the yeast pill today and on Sunday.

## 2016-02-17 NOTE — Progress Notes (Signed)
    Kayla Cannon November 05, 1950 [id number]        65 y.o.  [id number] presents complaining of some urinary discomfort for the past month. Notes when she voids and irritative feeling deeper inside. No frequency urgency low back pain fever or chills. Thought that she was developing a UTI. No vaginal discharge or odor. Just finished amoxicillin for bronchitis yesterday.  Past medical history,surgical history, problem list, medications, allergies, family history and social history were all reviewed and documented in the EPIC chart.  Directed ROS with pertinent positives and negatives documented in the history of present illness/assessment and plan.  Exam: [name] assistant Filed Vitals:   02/17/16 1202  BP: 126/80  Height: 5\' 1"  (1.549 m)  Weight: 135 lb (61.236 kg)   General appearance:  Normal Spine straight without CVA tenderness Abdomen soft nontender without masses guarding rebound Pelvic external BUS vagina with atrophic changes. Slight white discharge noted.  Cervix with atrophic changes. Uterus normal size midline mobile nontender. Adnexa without masses or tenderness.  Assessment/Plan:  65 y.o. [name] with history and exam as above. Wet prep is positive for yeast. I think she is having some yeast urethritis contribute to her symptoms. No evidence of UTI on urinalysis. Will treat with Diflucan 150 mg today and repeat 2 days from now just to make sure we eradicate her yeast given recent ampicillin use and one month history of symptoms. Follow up if symptoms persist, worsen or recur.    Anastasio Auerbach MD, 12:28 PM 02/17/2016

## 2016-05-02 ENCOUNTER — Encounter: Payer: Self-pay | Admitting: Gynecology

## 2016-05-02 ENCOUNTER — Ambulatory Visit (INDEPENDENT_AMBULATORY_CARE_PROVIDER_SITE_OTHER): Payer: BLUE CROSS/BLUE SHIELD | Admitting: Gynecology

## 2016-05-02 VITALS — BP 120/76 | Ht 61.0 in | Wt 140.0 lb

## 2016-05-02 DIAGNOSIS — Z01419 Encounter for gynecological examination (general) (routine) without abnormal findings: Secondary | ICD-10-CM | POA: Diagnosis not present

## 2016-05-02 DIAGNOSIS — M8580 Other specified disorders of bone density and structure, unspecified site: Secondary | ICD-10-CM | POA: Diagnosis not present

## 2016-05-02 DIAGNOSIS — R6882 Decreased libido: Secondary | ICD-10-CM | POA: Diagnosis not present

## 2016-05-02 DIAGNOSIS — N952 Postmenopausal atrophic vaginitis: Secondary | ICD-10-CM | POA: Diagnosis not present

## 2016-05-02 NOTE — Patient Instructions (Signed)
Follow up for bone density as scheduled.  You may obtain a copy of any labs that were done today by logging onto MyChart as outlined in the instructions provided with your AVS (after visit summary). The office will not call with normal lab results but certainly if there are any significant abnormalities then we will contact you.   Health Maintenance Adopting a healthy lifestyle and getting preventive care can go a long way to promote health and wellness. Talk with your health care provider about what schedule of regular examinations is right for you. This is a good chance for you to check in with your provider about disease prevention and staying healthy. In between checkups, there are plenty of things you can do on your own. Experts have done a lot of research about which lifestyle changes and preventive measures are most likely to keep you healthy. Ask your health care provider for more information. WEIGHT AND DIET  Eat a healthy diet  Be sure to include plenty of vegetables, fruits, low-fat dairy products, and lean protein.  Do not eat a lot of foods high in solid fats, added sugars, or salt.  Get regular exercise. This is one of the most important things you can do for your health.  Most adults should exercise for at least 150 minutes each week. The exercise should increase your heart rate and make you sweat (moderate-intensity exercise).  Most adults should also do strengthening exercises at least twice a week. This is in addition to the moderate-intensity exercise.  Maintain a healthy weight  Body mass index (BMI) is a measurement that can be used to identify possible weight problems. It estimates body fat based on height and weight. Your health care provider can help determine your BMI and help you achieve or maintain a healthy weight.  For females 69 years of age and older:   A BMI below 18.5 is considered underweight.  A BMI of 18.5 to 24.9 is normal.  A BMI of 25 to 29.9 is  considered overweight.  A BMI of 30 and above is considered obese.  Watch levels of cholesterol and blood lipids  You should start having your blood tested for lipids and cholesterol at 64 years of age, then have this test every 5 years.  You may need to have your cholesterol levels checked more often if:  Your lipid or cholesterol levels are high.  You are older than 64 years of age.  You are at high risk for heart disease.  CANCER SCREENING   Lung Cancer  Lung cancer screening is recommended for adults 30-82 years old who are at high risk for lung cancer because of a history of smoking.  A yearly low-dose CT scan of the lungs is recommended for people who:  Currently smoke.  Have quit within the past 15 years.  Have at least a 30-pack-year history of smoking. A pack year is smoking an average of one pack of cigarettes a day for 1 year.  Yearly screening should continue until it has been 15 years since you quit.  Yearly screening should stop if you develop a health problem that would prevent you from having lung cancer treatment.  Breast Cancer  Practice breast self-awareness. This means understanding how your breasts normally appear and feel.  It also means doing regular breast self-exams. Let your health care provider know about any changes, no matter how small.  If you are in your 20s or 30s, you should have a clinical breast exam (  CBE) by a health care provider every 1-3 years as part of a regular health exam.  If you are 56 or older, have a CBE every year. Also consider having a breast X-ray (mammogram) every year.  If you have a family history of breast cancer, talk to your health care provider about genetic screening.  If you are at high risk for breast cancer, talk to your health care provider about having an MRI and a mammogram every year.  Breast cancer gene (BRCA) assessment is recommended for women who have family members with BRCA-related cancers.  BRCA-related cancers include:  Breast.  Ovarian.  Tubal.  Peritoneal cancers.  Results of the assessment will determine the need for genetic counseling and BRCA1 and BRCA2 testing. Cervical Cancer Routine pelvic examinations to screen for cervical cancer are no longer recommended for nonpregnant women who are considered low risk for cancer of the pelvic organs (ovaries, uterus, and vagina) and who do not have symptoms. A pelvic examination may be necessary if you have symptoms including those associated with pelvic infections. Ask your health care provider if a screening pelvic exam is right for you.   The Pap test is the screening test for cervical cancer for women who are considered at risk.  If you had a hysterectomy for a problem that was not cancer or a condition that could lead to cancer, then you no longer need Pap tests.  If you are older than 65 years, and you have had normal Pap tests for the past 10 years, you no longer need to have Pap tests.  If you have had past treatment for cervical cancer or a condition that could lead to cancer, you need Pap tests and screening for cancer for at least 20 years after your treatment.  If you no longer get a Pap test, assess your risk factors if they change (such as having a new sexual partner). This can affect whether you should start being screened again.  Some women have medical problems that increase their chance of getting cervical cancer. If this is the case for you, your health care provider may recommend more frequent screening and Pap tests.  The human papillomavirus (HPV) test is another test that may be used for cervical cancer screening. The HPV test looks for the virus that can cause cell changes in the cervix. The cells collected during the Pap test can be tested for HPV.  The HPV test can be used to screen women 39 years of age and older. Getting tested for HPV can extend the interval between normal Pap tests from three to  five years.  An HPV test also should be used to screen women of any age who have unclear Pap test results.  After 65 years of age, women should have HPV testing as often as Pap tests.  Colorectal Cancer  This type of cancer can be detected and often prevented.  Routine colorectal cancer screening usually begins at 64 years of age and continues through 64 years of age.  Your health care provider may recommend screening at an earlier age if you have risk factors for colon cancer.  Your health care provider may also recommend using home test kits to check for hidden blood in the stool.  A small camera at the end of a tube can be used to examine your colon directly (sigmoidoscopy or colonoscopy). This is done to check for the earliest forms of colorectal cancer.  Routine screening usually begins at age 77.  Direct examination of the colon should be repeated every 5-10 years through 64 years of age. However, you may need to be screened more often if early forms of precancerous polyps or small growths are found. Skin Cancer  Check your skin from head to toe regularly.  Tell your health care provider about any new moles or changes in moles, especially if there is a change in a mole's shape or color.  Also tell your health care provider if you have a mole that is larger than the size of a pencil eraser.  Always use sunscreen. Apply sunscreen liberally and repeatedly throughout the day.  Protect yourself by wearing long sleeves, pants, a wide-brimmed hat, and sunglasses whenever you are outside. HEART DISEASE, DIABETES, AND HIGH BLOOD PRESSURE   Have your blood pressure checked at least every 1-2 years. High blood pressure causes heart disease and increases the risk of stroke.  If you are between 76 years and 55 years old, ask your health care provider if you should take aspirin to prevent strokes.  Have regular diabetes screenings. This involves taking a blood sample to check your  fasting blood sugar level.  If you are at a normal weight and have a low risk for diabetes, have this test once every three years after 64 years of age.  If you are overweight and have a high risk for diabetes, consider being tested at a younger age or more often. PREVENTING INFECTION  Hepatitis B  If you have a higher risk for hepatitis B, you should be screened for this virus. You are considered at high risk for hepatitis B if:  You were born in a country where hepatitis B is common. Ask your health care provider which countries are considered high risk.  Your parents were born in a high-risk country, and you have not been immunized against hepatitis B (hepatitis B vaccine).  You have HIV or AIDS.  You use needles to inject street drugs.  You live with someone who has hepatitis B.  You have had sex with someone who has hepatitis B.  You get hemodialysis treatment.  You take certain medicines for conditions, including cancer, organ transplantation, and autoimmune conditions. Hepatitis C  Blood testing is recommended for:  Everyone born from [birth year] through 1964.  Anyone with known risk factors for hepatitis C. Sexually transmitted infections (STIs)  You should be screened for sexually transmitted infections (STIs) including gonorrhea and chlamydia if:  You are sexually active and are younger than 64 years of age.  You are older than 64 years of age and your health care provider tells you that you are at risk for this type of infection.  Your sexual activity has changed since you were last screened and you are at an increased risk for chlamydia or gonorrhea. Ask your health care provider if you are at risk.  If you do not have HIV, but are at risk, it may be recommended that you take a prescription medicine daily to prevent HIV infection. This is called pre-exposure prophylaxis (PrEP). You are considered at risk if:  You are sexually active and do not regularly use condoms or  know the HIV status of your partner(s).  You take drugs by injection.  You are sexually active with a partner who has HIV. Talk with your health care provider about whether you are at high risk of being infected with HIV. If you choose to begin PrEP, you should first be tested for HIV. You should then be  tested every 3 months for as long as you are taking PrEP.  PREGNANCY   If you are premenopausal and you may become pregnant, ask your health care provider about preconception counseling.  If you may become pregnant, take 400 to 800 micrograms (mcg) of folic acid every day.  If you want to prevent pregnancy, talk to your health care provider about birth control (contraception). OSTEOPOROSIS AND MENOPAUSE   Osteoporosis is a disease in which the bones lose minerals and strength with aging. This can result in serious bone fractures. Your risk for osteoporosis can be identified using a bone density scan.  If you are 25 years of age or older, or if you are at risk for osteoporosis and fractures, ask your health care provider if you should be screened.  Ask your health care provider whether you should take a calcium or vitamin D supplement to lower your risk for osteoporosis.  Menopause may have certain physical symptoms and risks.  Hormone replacement therapy may reduce some of these symptoms and risks. Talk to your health care provider about whether hormone replacement therapy is right for you.  HOME CARE INSTRUCTIONS   Schedule regular health, dental, and eye exams.  Stay current with your immunizations.   Do not use any tobacco products including cigarettes, chewing tobacco, or electronic cigarettes.  If you are pregnant, do not drink alcohol.  If you are breastfeeding, limit how much and how often you drink alcohol.  Limit alcohol intake to no more than 1 drink per day for nonpregnant women. One drink equals 12 ounces of beer, 5 ounces of wine, or 1 ounces of hard liquor.  Do  not use street drugs.  Do not share needles.  Ask your health care provider for help if you need support or information about quitting drugs.  Tell your health care provider if you often feel depressed.  Tell your health care provider if you have ever been abused or do not feel safe at home. Document Released: 02/12/2011 Document Revised: 12/14/2013 Document Reviewed: 07/01/2013 [facility] Patient Information 2015 [place], [place]. This information is not intended to replace advice given to you by your health care provider. Make sure you discuss any questions you have with your health care provider.

## 2016-05-02 NOTE — Progress Notes (Signed)
    Kayla Cannon [birth year] [id number]        65 y.o.  [id number]  for annual exam.  Several issues noted below.  Past medical history,surgical history, problem list, medications, allergies, family history and social history were all reviewed and documented as reviewed in the EPIC chart.  ROS:  Performed with pertinent positives and negatives included in the history, assessment and plan.   Additional significant findings :  None   Exam: Caryn Bee assistant Vitals:   05/02/16 1511  BP: 120/76  Weight: 140 lb (63.5 kg)  Height: 5\' 1"  (1.549 m)   Body mass index is 26.45 kg/m.  General appearance:  Normal affect, orientation and appearance. Skin: Grossly normal HEENT: Without gross lesions.  No cervical or supraclavicular adenopathy. Thyroid normal.  Lungs:  Clear without wheezing, rales or rhonchi Cardiac: RR, without RMG Abdominal:  Soft, nontender, without masses, guarding, rebound, organomegaly or hernia Breasts:  Examined lying and sitting without masses, retractions, discharge or axillary adenopathy. Pelvic:  Ext, BUS, Vagina with atrophic changes  Cervix atrophic  Uterus retroverted, normal size, shape and contour, midline and mobile nontender   Adnexa without masses or tenderness    Anus and perineum normal   Rectovaginal normal sphincter tone without palpated masses or tenderness.    Assessment/Plan:  65 y.o. [id number] female for annual exam.   1. Postmenopausal/atrophic genital changes. Patient using estradiol 0.02% cream twice weekly. Is doing well with this and wants to continue. I reviewed the issues of vaginal estrogen and the names 2017 new guidelines. The issues of absorption with systemic effect to include thrombosis endometrial stimulation impossible breast affect reviewed. Benefits as far as vaginal health also discussed. Patient's comfortable continuing and I refilled her 1 year. 2. Decreased libido. Is using 2% testosterone apply twice weekly in the  periclitoral region. The risks of absorption and systemic androgenic effects to include hair growth weight acne adverse lipid profile and up to including liver dysfunction all reviewed. Patient's comfortable with this and feels that the benefits outweigh the risks. Refill 1 year provided. 3. Osteopenia. DEXA 27/2015 T score -2. I reviewed the issues of osteopenia risks of fracture. She did take Fosamax for approximately 6 years on a drug-free holiday now. Will follow up with DEXA now and patient will schedule. 4. Mammography 10/2015. Continue with annual mammography when due. SBE monthly reviewed with her. She was asking about the new guidelines from ACOG and we reviewed screening mammographic recommendations from ACOG, [place] and [place] health preventative task force. 5. Colonoscopy 2014. Repeat at their recommended interval. 6. Pap smear/HPV 2015. No Pap smear done today. No history of significant abnormal Pap smears. Plan repeat Pap smear approaching 5 year interval per current screening guidelines. 7. Health maintenance. No routine blood work done as patient reports this done elsewhere. Did have recent treatment for UTI. Will check clean catch urine today. Follow up for bone density otherwise in one year, sooner as needed  Greater than 15 minutes of my time in excess of her routine gynecologic exam exam was spent in direct face to face counseling and coordination of care in regards to her vaginal atrophy with estrogen treatment and review of guidelines, decreased libido with use of testosterone in its risks and benefits, osteopenia and SBE/mammographic current recommendations.    Anastasio Auerbach MD, 3:57 PM 05/02/2016

## 2016-05-03 LAB — URINALYSIS W MICROSCOPIC + REFLEX CULTURE
BILIRUBIN URINE: NEGATIVE
Bacteria, UA: NONE SEEN [HPF]
CRYSTALS: NONE SEEN [HPF]
Casts: NONE SEEN [LPF]
Glucose, UA: NEGATIVE
Hgb urine dipstick: NEGATIVE
KETONES UR: NEGATIVE
Leukocytes, UA: NEGATIVE
Nitrite: NEGATIVE
Protein, ur: NEGATIVE
RBC / HPF: NONE SEEN RBC/HPF (ref <=2)
Specific Gravity, Urine: 1.011 (ref 1.001–1.035)
WBC UA: NONE SEEN WBC/HPF (ref <=5)
Yeast: NONE SEEN [HPF]
pH: 7.5 (ref 5.0–8.0)

## 2016-06-12 ENCOUNTER — Telehealth: Payer: Self-pay | Admitting: Gynecology

## 2016-06-12 ENCOUNTER — Encounter: Payer: Self-pay | Admitting: Gynecology

## 2016-06-12 ENCOUNTER — Ambulatory Visit (INDEPENDENT_AMBULATORY_CARE_PROVIDER_SITE_OTHER): Payer: BLUE CROSS/BLUE SHIELD

## 2016-06-12 ENCOUNTER — Other Ambulatory Visit: Payer: Self-pay | Admitting: Gynecology

## 2016-06-12 DIAGNOSIS — M8580 Other specified disorders of bone density and structure, unspecified site: Secondary | ICD-10-CM

## 2016-06-12 DIAGNOSIS — Z13820 Encounter for screening for osteoporosis: Secondary | ICD-10-CM

## 2016-06-12 NOTE — Telephone Encounter (Signed)
Pt informed with the below note. 

## 2016-06-12 NOTE — Telephone Encounter (Signed)
Tell patient her most recent bone density does show some loss of calcium from the bones. Recommend office visit so we can discuss whether we want to consider treatment at this time or not.

## 2016-06-28 ENCOUNTER — Ambulatory Visit (INDEPENDENT_AMBULATORY_CARE_PROVIDER_SITE_OTHER): Payer: BLUE CROSS/BLUE SHIELD | Admitting: Gynecology

## 2016-06-28 ENCOUNTER — Encounter: Payer: Self-pay | Admitting: Gynecology

## 2016-06-28 VITALS — BP 120/76

## 2016-06-28 DIAGNOSIS — M8589 Other specified disorders of bone density and structure, multiple sites: Secondary | ICD-10-CM

## 2016-06-28 NOTE — Progress Notes (Signed)
    Kayla MOLZAHN 1951-07-28 [id number]        65 y.o.  [id number] presents for discussion with her husband of her most recent bone density which shows osteopenia T score -2.4 AP spine.  Does have a history of prior Fosamax use for approximately 6 years discontinued approximately 7 years ago. She shows a 4.7% statistically significant decline in her spine from her prior DEXA 2015. Her spine T score -2.0 in 2015 was a 3.7% statistically significant decline from her scan 2 years prior to that. A FRAX that was calculated with her last density was 8.7%/0.8%.  Past medical history,surgical history, problem list, medications, allergies, family history and social history were all reviewed and documented in the EPIC chart.  Directed ROS with pertinent positives and negatives documented in the history of present illness/assessment and plan.  Exam: Vitals:   06/28/16 1017  BP: 120/76   General appearance:  Normal   Assessment/Plan:  65 y.o. [name] The patient, her husband and I had a detailed discussion reviewing her bone density in detail. I reviewed and compared her prior studies. I discussed her declining T score of the spine over the last several bone densities and the issues as to whether we wait until she crosses into osteoporosis to discuss treatment or consider that now. I also reviewed with her the FRAX which is debated should be not considered given her prior treatment as well as its T score measurement is femur and not her worst AP spine measurement.  Also discussed I have do not have access to the prior records when she was started on her Fosamax as to indication. I reviewed treatment options to include reinitiation of Fosamax or other bisphosphate, Prolia, Evista, Forteo. The pros/cons of each choice reviewed. The risks also discussed to include GERD, osteonecrosis of the jaw, atypical fractures, rashes, infections, breast benefits of Evista versus thrombosis risks and the issues of Forteo. After  a lengthy discussion the patient wants to initiate treatment. Given that she has had 6 years of Fosamax will start with Prolia and plan on 6 month administration with repeat DEXA in 2 years.  Greater than 50% of my time was spent in direct face to face counseling and coordination of care with the patient.     Anastasio Auerbach MD, 5:10 PM 06/28/2016

## 2016-06-28 NOTE — Patient Instructions (Signed)
Office will call you to arrange for the Prolia.

## 2016-10-04 ENCOUNTER — Telehealth: Payer: Self-pay | Admitting: Gynecology

## 2016-10-04 DIAGNOSIS — M8589 Other specified disorders of bone density and structure, multiple sites: Secondary | ICD-10-CM

## 2016-10-04 NOTE — Telephone Encounter (Signed)
Just received a phone call from this patient regarding no phone follow up for Prolia.   No information from MD about starting pt on Prolia has bee sent to me. Pt seen in 06/2016 by TF.   Dr Phineas Real I read your note on 06/28/16  This patient has osteopenia with history of Fosamax. Prolia is only a covered medication for osteoporosis. No history of fragility fracture. Reclast is covered treatment for osteopenia.

## 2016-10-04 NOTE — Telephone Encounter (Signed)
Duplicate note

## 2016-10-04 NOTE — Telephone Encounter (Signed)
My recommendation would be to proceed with Reclast. Explained to the patient the situation where Medicare no longer pays for Prolia. Her options would be to restart oral bisphosphonate such as Fosamax/Actonel or to start on Reclast which is the once a year IV infusion. Alternative would be not to start on anything and recheck her bone density at a 2 year interval. My recommendation would be to consider Reclast

## 2016-10-09 NOTE — Telephone Encounter (Addendum)
Kayla Cannon would like to consider Reclast and wanted to check insurance benefits for infusion. We will call her with benefits and then schedule her lab work. No insurance co change. Dx [id number]

## 2016-10-10 ENCOUNTER — Telehealth: Payer: Self-pay | Admitting: *Deleted

## 2016-10-10 NOTE — Telephone Encounter (Signed)
Pt called and left message in triage voicemail stating she will be on medicare next year and if she should wait for treatment due to this. And concerned about approved with insurance for reclast,  I left in pt voicemail that authorization would have to be done to confirm out of pocket cost if any.

## 2016-10-14 ENCOUNTER — Other Ambulatory Visit: Payer: Self-pay | Admitting: Gynecology

## 2016-10-16 NOTE — Telephone Encounter (Signed)
Patient  Would like to have Reclast infusion does not want to start Fosamax again. Scheduled for blood work 10/18/16 2 pm

## 2016-10-18 ENCOUNTER — Other Ambulatory Visit: Payer: BLUE CROSS/BLUE SHIELD

## 2016-10-18 DIAGNOSIS — M8589 Other specified disorders of bone density and structure, multiple sites: Secondary | ICD-10-CM

## 2016-10-18 LAB — CREATININE, SERUM: CREATININE: 1.17 mg/dL — AB (ref 0.50–0.99)

## 2016-10-18 LAB — CALCIUM: Calcium: 9.5 mg/dL (ref 8.6–10.4)

## 2016-10-23 NOTE — Telephone Encounter (Signed)
Results for DELSA, WALDER (MRN [id number]) as of 10/23/2016 16:54  Ref. Range 10/18/2016 14:11  Creatinine Latest Ref Range: 0.50 - 0.99 mg/dL 1.17 (H)   Calcium 9.5 10/18/16. I will check with Dr Phineas Real about Creatinine level before I schedule Reclast.

## 2016-10-24 NOTE — Telephone Encounter (Signed)
Recommend patient rechecking a BUN and creatinine in one month before deciding about Reclast. Make sure she keeps herself well hydrated

## 2016-10-25 NOTE — Telephone Encounter (Addendum)
PC to pt left message to call me about increasing her fluid intake and repeat lab work prior to [employer]. She will return for blood work April 30 th 10 30 Reclast appointments available are 05/08, 05/09, 05/18, 05/21 , 05/22 or pm of 05/111  Pm only we will wait on blood  Work.

## 2016-12-10 ENCOUNTER — Other Ambulatory Visit: Payer: BLUE CROSS/BLUE SHIELD

## 2016-12-10 ENCOUNTER — Other Ambulatory Visit: Payer: Self-pay | Admitting: Gynecology

## 2016-12-10 DIAGNOSIS — M8589 Other specified disorders of bone density and structure, multiple sites: Secondary | ICD-10-CM

## 2016-12-10 LAB — BUN: BUN: 19 mg/dL (ref 7–25)

## 2016-12-11 LAB — CREATININE, SERUM: Creat: 0.93 mg/dL (ref 0.50–0.99)

## 2016-12-11 LAB — CALCIUM: CALCIUM: 9.7 mg/dL (ref 8.6–10.4)

## 2016-12-11 NOTE — Telephone Encounter (Addendum)
  Results for Kayla Cannon, Kayla Cannon (MRN [id number]) as of 12/11/2016 15:17  Ref. Range 12/10/2016 11:48  BUN Latest Ref Range: 7 - 25 mg/dL 19  Creatinine Latest Ref Range: 0.50 - 0.99 mg/dL 0.93  Calcium Latest Ref Range: 8.6 - 10.4 mg/dL 9.7   Phone call to pt left VM regarding appointment and possible PA from insurance.  Insurance benefits . Deductible $2500 (0 met), Allowable [id number]  799.00  [id number]  165.87  Total cost for pt $964.87  OOPM $7350 (105 met)  Ref # [id number] or [id number]  [place] [employer] no PA needed. .  And co insurance with approx cost. [employer] allowable [id number] $799  [id number]  $165.87  Total cost approx cost for pt.  Appointment 12/28/16 at 9 am [facility].

## 2016-12-19 ENCOUNTER — Encounter (HOSPITAL_COMMUNITY): Payer: No Typology Code available for payment source

## 2016-12-21 ENCOUNTER — Other Ambulatory Visit: Payer: Self-pay | Admitting: Internal Medicine

## 2016-12-21 DIAGNOSIS — Z1231 Encounter for screening mammogram for malignant neoplasm of breast: Secondary | ICD-10-CM

## 2016-12-27 ENCOUNTER — Other Ambulatory Visit (HOSPITAL_COMMUNITY): Payer: Self-pay | Admitting: *Deleted

## 2016-12-28 ENCOUNTER — Ambulatory Visit (HOSPITAL_COMMUNITY)
Admission: RE | Admit: 2016-12-28 | Discharge: 2016-12-28 | Disposition: A | Discharge status: Home / Self Care | Payer: BLUE CROSS/BLUE SHIELD | Source: Ambulatory Visit | Attending: Gynecology | Admitting: Gynecology

## 2016-12-28 DIAGNOSIS — M8589 Other specified disorders of bone density and structure, multiple sites: Secondary | ICD-10-CM | POA: Diagnosis present

## 2016-12-28 MED ORDER — ZOLEDRONIC ACID 5 MG/100ML IV SOLN
5.0000 mg | Freq: Once | INTRAVENOUS | Status: AC
  Administered 2016-12-28: 5 mg via INTRAVENOUS

## 2016-12-28 MED ORDER — ZOLEDRONIC ACID 5 MG/100ML IV SOLN
INTRAVENOUS | Status: AC
  Administered 2016-12-28: 5 mg via INTRAVENOUS
  Filled 2016-12-28: qty 100

## 2016-12-28 NOTE — Discharge Instructions (Signed)

## 2017-01-08 NOTE — Telephone Encounter (Signed)
Reclast on 12/28/16 , next due 12/29/17

## 2017-01-21 ENCOUNTER — Ambulatory Visit
Admission: RE | Admit: 2017-01-21 | Discharge: 2017-01-21 | Disposition: A | Discharge status: Home / Self Care | Payer: BLUE CROSS/BLUE SHIELD | Source: Ambulatory Visit | Attending: Internal Medicine | Admitting: Internal Medicine

## 2017-01-21 DIAGNOSIS — Z1231 Encounter for screening mammogram for malignant neoplasm of breast: Secondary | ICD-10-CM

## 2017-05-06 ENCOUNTER — Encounter: Payer: BLUE CROSS/BLUE SHIELD | Admitting: Gynecology

## 2017-07-14 DIAGNOSIS — J069 Acute upper respiratory infection, unspecified: Secondary | ICD-10-CM | POA: Diagnosis not present

## 2017-08-03 DIAGNOSIS — J069 Acute upper respiratory infection, unspecified: Secondary | ICD-10-CM | POA: Diagnosis not present

## 2017-08-29 DIAGNOSIS — M542 Cervicalgia: Secondary | ICD-10-CM | POA: Diagnosis not present

## 2017-08-29 DIAGNOSIS — M545 Low back pain: Secondary | ICD-10-CM | POA: Diagnosis not present

## 2017-09-03 DIAGNOSIS — M545 Low back pain: Secondary | ICD-10-CM | POA: Diagnosis not present

## 2017-09-03 DIAGNOSIS — M542 Cervicalgia: Secondary | ICD-10-CM | POA: Diagnosis not present

## 2017-09-11 DIAGNOSIS — M542 Cervicalgia: Secondary | ICD-10-CM | POA: Diagnosis not present

## 2017-09-11 DIAGNOSIS — M545 Low back pain: Secondary | ICD-10-CM | POA: Diagnosis not present

## 2017-10-25 ENCOUNTER — Ambulatory Visit (INDEPENDENT_AMBULATORY_CARE_PROVIDER_SITE_OTHER): Payer: Medicare Other | Admitting: Psychology

## 2017-10-25 DIAGNOSIS — F4323 Adjustment disorder with mixed anxiety and depressed mood: Secondary | ICD-10-CM | POA: Diagnosis not present

## 2017-11-21 DIAGNOSIS — M545 Low back pain: Secondary | ICD-10-CM | POA: Diagnosis not present

## 2017-11-21 DIAGNOSIS — M542 Cervicalgia: Secondary | ICD-10-CM | POA: Diagnosis not present

## 2017-12-04 ENCOUNTER — Telehealth: Payer: Self-pay | Admitting: *Deleted

## 2017-12-04 NOTE — Telephone Encounter (Addendum)
Recast instructions  Annual Exam (1 year) ? Called pt left voice mail  Called pt to verify insurance coverage / inform pt Reclast is in Process ?  Labs must be in 30 days window Serum Creatinine DATE? NEED LABS Serum Calcium DATE? NEED LABS   PA needed? NO (REFERNCE # [id number] [phone] provider line medicare Cost for Pt? 20% co-insurance =$144    Order form filled out and faxed  w/MD sig?  Infusion will be done at ?  Pt aware?  Instructions mailed to pt?

## 2017-12-10 DIAGNOSIS — M545 Low back pain: Secondary | ICD-10-CM | POA: Diagnosis not present

## 2017-12-10 DIAGNOSIS — M542 Cervicalgia: Secondary | ICD-10-CM | POA: Diagnosis not present

## 2017-12-11 NOTE — Telephone Encounter (Signed)
Pt states she has changed Offices will close encounter.

## 2017-12-16 DIAGNOSIS — Z8371 Family history of colonic polyps: Secondary | ICD-10-CM | POA: Diagnosis not present

## 2017-12-16 DIAGNOSIS — D124 Benign neoplasm of descending colon: Secondary | ICD-10-CM | POA: Diagnosis not present

## 2017-12-16 DIAGNOSIS — K635 Polyp of colon: Secondary | ICD-10-CM | POA: Diagnosis not present

## 2017-12-16 DIAGNOSIS — Z1211 Encounter for screening for malignant neoplasm of colon: Secondary | ICD-10-CM | POA: Diagnosis not present

## 2017-12-16 DIAGNOSIS — D126 Benign neoplasm of colon, unspecified: Secondary | ICD-10-CM | POA: Diagnosis not present

## 2017-12-27 DIAGNOSIS — M545 Low back pain: Secondary | ICD-10-CM | POA: Diagnosis not present

## 2017-12-27 DIAGNOSIS — M542 Cervicalgia: Secondary | ICD-10-CM | POA: Diagnosis not present

## 2018-01-13 DIAGNOSIS — M545 Low back pain: Secondary | ICD-10-CM | POA: Diagnosis not present

## 2018-01-13 DIAGNOSIS — M542 Cervicalgia: Secondary | ICD-10-CM | POA: Diagnosis not present

## 2018-02-11 DIAGNOSIS — Z1231 Encounter for screening mammogram for malignant neoplasm of breast: Secondary | ICD-10-CM | POA: Diagnosis not present

## 2018-02-19 DIAGNOSIS — L309 Dermatitis, unspecified: Secondary | ICD-10-CM | POA: Diagnosis not present

## 2018-02-19 DIAGNOSIS — L821 Other seborrheic keratosis: Secondary | ICD-10-CM | POA: Diagnosis not present

## 2018-02-25 DIAGNOSIS — M545 Low back pain: Secondary | ICD-10-CM | POA: Diagnosis not present

## 2018-02-25 DIAGNOSIS — M542 Cervicalgia: Secondary | ICD-10-CM | POA: Diagnosis not present

## 2018-03-10 DIAGNOSIS — D2321 Other benign neoplasm of skin of right ear and external auricular canal: Secondary | ICD-10-CM | POA: Diagnosis not present

## 2018-03-10 DIAGNOSIS — H903 Sensorineural hearing loss, bilateral: Secondary | ICD-10-CM | POA: Diagnosis not present

## 2018-03-12 DIAGNOSIS — L819 Disorder of pigmentation, unspecified: Secondary | ICD-10-CM | POA: Diagnosis not present

## 2018-05-09 DIAGNOSIS — I493 Ventricular premature depolarization: Secondary | ICD-10-CM | POA: Diagnosis not present

## 2018-05-09 DIAGNOSIS — M8580 Other specified disorders of bone density and structure, unspecified site: Secondary | ICD-10-CM | POA: Diagnosis not present

## 2018-05-09 DIAGNOSIS — Z23 Encounter for immunization: Secondary | ICD-10-CM | POA: Diagnosis not present

## 2018-05-09 DIAGNOSIS — F419 Anxiety disorder, unspecified: Secondary | ICD-10-CM | POA: Diagnosis not present

## 2018-05-09 DIAGNOSIS — E785 Hyperlipidemia, unspecified: Secondary | ICD-10-CM | POA: Diagnosis not present

## 2018-05-14 DIAGNOSIS — R6882 Decreased libido: Secondary | ICD-10-CM | POA: Diagnosis not present

## 2018-05-14 DIAGNOSIS — Z124 Encounter for screening for malignant neoplasm of cervix: Secondary | ICD-10-CM | POA: Diagnosis not present

## 2018-05-14 DIAGNOSIS — N952 Postmenopausal atrophic vaginitis: Secondary | ICD-10-CM | POA: Diagnosis not present

## 2018-05-19 ENCOUNTER — Other Ambulatory Visit: Payer: Self-pay | Admitting: Obstetrics & Gynecology

## 2018-05-19 DIAGNOSIS — M8580 Other specified disorders of bone density and structure, unspecified site: Secondary | ICD-10-CM

## 2018-06-09 DIAGNOSIS — E7800 Pure hypercholesterolemia, unspecified: Secondary | ICD-10-CM | POA: Diagnosis not present

## 2018-06-09 DIAGNOSIS — E663 Overweight: Secondary | ICD-10-CM | POA: Diagnosis not present

## 2018-06-09 DIAGNOSIS — R635 Abnormal weight gain: Secondary | ICD-10-CM | POA: Diagnosis not present

## 2018-06-09 DIAGNOSIS — R7309 Other abnormal glucose: Secondary | ICD-10-CM | POA: Diagnosis not present

## 2018-06-09 DIAGNOSIS — Z6828 Body mass index (BMI) 28.0-28.9, adult: Secondary | ICD-10-CM | POA: Diagnosis not present

## 2018-07-01 DIAGNOSIS — R635 Abnormal weight gain: Secondary | ICD-10-CM | POA: Diagnosis not present

## 2018-07-01 DIAGNOSIS — E663 Overweight: Secondary | ICD-10-CM | POA: Diagnosis not present

## 2018-07-01 DIAGNOSIS — E785 Hyperlipidemia, unspecified: Secondary | ICD-10-CM | POA: Diagnosis not present

## 2018-07-08 DIAGNOSIS — E663 Overweight: Secondary | ICD-10-CM | POA: Diagnosis not present

## 2018-07-08 DIAGNOSIS — Z6828 Body mass index (BMI) 28.0-28.9, adult: Secondary | ICD-10-CM | POA: Diagnosis not present

## 2018-07-17 DIAGNOSIS — E663 Overweight: Secondary | ICD-10-CM | POA: Diagnosis not present

## 2018-07-17 DIAGNOSIS — Z6828 Body mass index (BMI) 28.0-28.9, adult: Secondary | ICD-10-CM | POA: Diagnosis not present

## 2018-07-21 ENCOUNTER — Ambulatory Visit
Admission: RE | Admit: 2018-07-21 | Discharge: 2018-07-21 | Disposition: A | Discharge status: Home / Self Care | Payer: Medicare Other | Source: Ambulatory Visit | Attending: Obstetrics & Gynecology | Admitting: Obstetrics & Gynecology

## 2018-07-21 DIAGNOSIS — M8589 Other specified disorders of bone density and structure, multiple sites: Secondary | ICD-10-CM | POA: Diagnosis not present

## 2018-07-21 DIAGNOSIS — M8580 Other specified disorders of bone density and structure, unspecified site: Secondary | ICD-10-CM

## 2018-07-21 DIAGNOSIS — Z780 Asymptomatic menopausal state: Secondary | ICD-10-CM | POA: Diagnosis not present

## 2018-07-30 DIAGNOSIS — M818 Other osteoporosis without current pathological fracture: Secondary | ICD-10-CM | POA: Diagnosis not present

## 2018-07-30 DIAGNOSIS — I493 Ventricular premature depolarization: Secondary | ICD-10-CM | POA: Diagnosis not present

## 2018-07-30 DIAGNOSIS — Z23 Encounter for immunization: Secondary | ICD-10-CM | POA: Diagnosis not present

## 2018-08-22 DIAGNOSIS — M8580 Other specified disorders of bone density and structure, unspecified site: Secondary | ICD-10-CM | POA: Diagnosis not present

## 2018-08-22 DIAGNOSIS — I493 Ventricular premature depolarization: Secondary | ICD-10-CM | POA: Diagnosis not present

## 2018-08-22 DIAGNOSIS — E785 Hyperlipidemia, unspecified: Secondary | ICD-10-CM | POA: Diagnosis not present

## 2018-08-22 DIAGNOSIS — F411 Generalized anxiety disorder: Secondary | ICD-10-CM | POA: Diagnosis not present

## 2018-08-28 DIAGNOSIS — B373 Candidiasis of vulva and vagina: Secondary | ICD-10-CM | POA: Diagnosis not present

## 2018-08-29 DIAGNOSIS — T162XXA Foreign body in left ear, initial encounter: Secondary | ICD-10-CM | POA: Diagnosis not present

## 2018-08-29 DIAGNOSIS — Z6827 Body mass index (BMI) 27.0-27.9, adult: Secondary | ICD-10-CM | POA: Diagnosis not present

## 2018-08-29 DIAGNOSIS — E663 Overweight: Secondary | ICD-10-CM | POA: Diagnosis not present

## 2018-08-29 DIAGNOSIS — Z713 Dietary counseling and surveillance: Secondary | ICD-10-CM | POA: Diagnosis not present

## 2018-10-24 DIAGNOSIS — Z713 Dietary counseling and surveillance: Secondary | ICD-10-CM | POA: Diagnosis not present

## 2018-10-24 DIAGNOSIS — Z6826 Body mass index (BMI) 26.0-26.9, adult: Secondary | ICD-10-CM | POA: Diagnosis not present

## 2018-10-24 DIAGNOSIS — E663 Overweight: Secondary | ICD-10-CM | POA: Diagnosis not present

## 2018-10-24 DIAGNOSIS — E785 Hyperlipidemia, unspecified: Secondary | ICD-10-CM | POA: Diagnosis not present

## 2018-12-10 DIAGNOSIS — E785 Hyperlipidemia, unspecified: Secondary | ICD-10-CM | POA: Diagnosis not present

## 2018-12-10 DIAGNOSIS — Z713 Dietary counseling and surveillance: Secondary | ICD-10-CM | POA: Diagnosis not present

## 2018-12-10 DIAGNOSIS — Z6825 Body mass index (BMI) 25.0-25.9, adult: Secondary | ICD-10-CM | POA: Diagnosis not present

## 2018-12-10 DIAGNOSIS — E663 Overweight: Secondary | ICD-10-CM | POA: Diagnosis not present

## 2019-02-04 DIAGNOSIS — R635 Abnormal weight gain: Secondary | ICD-10-CM | POA: Diagnosis not present

## 2019-02-04 DIAGNOSIS — E785 Hyperlipidemia, unspecified: Secondary | ICD-10-CM | POA: Diagnosis not present

## 2019-03-02 DIAGNOSIS — H2513 Age-related nuclear cataract, bilateral: Secondary | ICD-10-CM | POA: Diagnosis not present

## 2019-03-02 DIAGNOSIS — H43813 Vitreous degeneration, bilateral: Secondary | ICD-10-CM | POA: Diagnosis not present

## 2019-03-02 DIAGNOSIS — H35372 Puckering of macula, left eye: Secondary | ICD-10-CM | POA: Diagnosis not present

## 2019-03-16 DIAGNOSIS — Z1231 Encounter for screening mammogram for malignant neoplasm of breast: Secondary | ICD-10-CM | POA: Diagnosis not present

## 2019-04-01 DIAGNOSIS — M8580 Other specified disorders of bone density and structure, unspecified site: Secondary | ICD-10-CM | POA: Diagnosis not present

## 2019-04-01 DIAGNOSIS — R5383 Other fatigue: Secondary | ICD-10-CM | POA: Diagnosis not present

## 2019-04-01 DIAGNOSIS — E785 Hyperlipidemia, unspecified: Secondary | ICD-10-CM | POA: Diagnosis not present

## 2019-04-01 DIAGNOSIS — I493 Ventricular premature depolarization: Secondary | ICD-10-CM | POA: Diagnosis not present

## 2019-04-25 DIAGNOSIS — Z23 Encounter for immunization: Secondary | ICD-10-CM | POA: Diagnosis not present

## 2019-04-28 DIAGNOSIS — H43813 Vitreous degeneration, bilateral: Secondary | ICD-10-CM | POA: Diagnosis not present

## 2019-04-28 DIAGNOSIS — H35373 Puckering of macula, bilateral: Secondary | ICD-10-CM | POA: Diagnosis not present

## 2019-05-01 DIAGNOSIS — R42 Dizziness and giddiness: Secondary | ICD-10-CM | POA: Diagnosis not present

## 2019-05-01 DIAGNOSIS — E785 Hyperlipidemia, unspecified: Secondary | ICD-10-CM | POA: Diagnosis not present

## 2019-05-01 DIAGNOSIS — I493 Ventricular premature depolarization: Secondary | ICD-10-CM | POA: Diagnosis not present

## 2019-05-20 ENCOUNTER — Encounter: Payer: Self-pay | Admitting: Gynecology

## 2019-05-25 DIAGNOSIS — E785 Hyperlipidemia, unspecified: Secondary | ICD-10-CM | POA: Diagnosis not present

## 2019-05-25 DIAGNOSIS — F411 Generalized anxiety disorder: Secondary | ICD-10-CM | POA: Diagnosis not present

## 2019-05-25 DIAGNOSIS — Z0000 Encounter for general adult medical examination without abnormal findings: Secondary | ICD-10-CM | POA: Diagnosis not present

## 2019-05-25 DIAGNOSIS — I493 Ventricular premature depolarization: Secondary | ICD-10-CM | POA: Diagnosis not present

## 2019-05-25 DIAGNOSIS — M8580 Other specified disorders of bone density and structure, unspecified site: Secondary | ICD-10-CM | POA: Diagnosis not present

## 2019-05-29 DIAGNOSIS — R635 Abnormal weight gain: Secondary | ICD-10-CM | POA: Diagnosis not present

## 2019-05-29 DIAGNOSIS — R948 Abnormal results of function studies of other organs and systems: Secondary | ICD-10-CM | POA: Diagnosis not present

## 2019-06-05 DIAGNOSIS — E785 Hyperlipidemia, unspecified: Secondary | ICD-10-CM | POA: Diagnosis not present

## 2019-06-18 DIAGNOSIS — M8580 Other specified disorders of bone density and structure, unspecified site: Secondary | ICD-10-CM | POA: Diagnosis not present

## 2019-06-18 DIAGNOSIS — R6882 Decreased libido: Secondary | ICD-10-CM | POA: Diagnosis not present

## 2019-06-18 DIAGNOSIS — Z01419 Encounter for gynecological examination (general) (routine) without abnormal findings: Secondary | ICD-10-CM | POA: Diagnosis not present

## 2019-06-18 DIAGNOSIS — Z87440 Personal history of urinary (tract) infections: Secondary | ICD-10-CM | POA: Diagnosis not present

## 2019-06-18 DIAGNOSIS — N952 Postmenopausal atrophic vaginitis: Secondary | ICD-10-CM | POA: Diagnosis not present

## 2019-06-18 DIAGNOSIS — Z124 Encounter for screening for malignant neoplasm of cervix: Secondary | ICD-10-CM | POA: Diagnosis not present

## 2019-07-08 DIAGNOSIS — H903 Sensorineural hearing loss, bilateral: Secondary | ICD-10-CM | POA: Diagnosis not present

## 2019-07-14 DIAGNOSIS — E785 Hyperlipidemia, unspecified: Secondary | ICD-10-CM | POA: Diagnosis not present

## 2019-07-27 DIAGNOSIS — Z23 Encounter for immunization: Secondary | ICD-10-CM | POA: Diagnosis not present

## 2019-08-12 DIAGNOSIS — R079 Chest pain, unspecified: Secondary | ICD-10-CM | POA: Diagnosis not present

## 2019-08-31 ENCOUNTER — Other Ambulatory Visit: Payer: Medicare Other

## 2019-09-01 DIAGNOSIS — E785 Hyperlipidemia, unspecified: Secondary | ICD-10-CM | POA: Diagnosis not present

## 2019-09-11 ENCOUNTER — Ambulatory Visit: Payer: Medicare Other

## 2019-11-18 DIAGNOSIS — M47812 Spondylosis without myelopathy or radiculopathy, cervical region: Secondary | ICD-10-CM | POA: Diagnosis not present

## 2019-11-18 DIAGNOSIS — S129XXA Fracture of neck, unspecified, initial encounter: Secondary | ICD-10-CM | POA: Diagnosis not present

## 2019-11-18 DIAGNOSIS — M542 Cervicalgia: Secondary | ICD-10-CM | POA: Diagnosis not present

## 2019-11-19 DIAGNOSIS — M50221 Other cervical disc displacement at C4-C5 level: Secondary | ICD-10-CM | POA: Diagnosis not present

## 2019-11-19 DIAGNOSIS — M47812 Spondylosis without myelopathy or radiculopathy, cervical region: Secondary | ICD-10-CM | POA: Diagnosis not present

## 2019-11-24 DIAGNOSIS — M542 Cervicalgia: Secondary | ICD-10-CM | POA: Diagnosis not present

## 2019-11-24 DIAGNOSIS — M5030 Other cervical disc degeneration, unspecified cervical region: Secondary | ICD-10-CM | POA: Diagnosis not present

## 2019-11-24 DIAGNOSIS — S129XXA Fracture of neck, unspecified, initial encounter: Secondary | ICD-10-CM | POA: Diagnosis not present

## 2019-11-24 DIAGNOSIS — M4722 Other spondylosis with radiculopathy, cervical region: Secondary | ICD-10-CM | POA: Diagnosis not present

## 2019-11-24 DIAGNOSIS — R030 Elevated blood-pressure reading, without diagnosis of hypertension: Secondary | ICD-10-CM | POA: Diagnosis not present

## 2019-12-11 DIAGNOSIS — L723 Sebaceous cyst: Secondary | ICD-10-CM | POA: Diagnosis not present

## 2019-12-11 DIAGNOSIS — B351 Tinea unguium: Secondary | ICD-10-CM | POA: Diagnosis not present

## 2019-12-11 DIAGNOSIS — L821 Other seborrheic keratosis: Secondary | ICD-10-CM | POA: Diagnosis not present

## 2019-12-15 DIAGNOSIS — M8580 Other specified disorders of bone density and structure, unspecified site: Secondary | ICD-10-CM | POA: Diagnosis not present

## 2019-12-15 DIAGNOSIS — E785 Hyperlipidemia, unspecified: Secondary | ICD-10-CM | POA: Diagnosis not present

## 2019-12-30 DIAGNOSIS — H2513 Age-related nuclear cataract, bilateral: Secondary | ICD-10-CM | POA: Diagnosis not present

## 2019-12-30 DIAGNOSIS — H35373 Puckering of macula, bilateral: Secondary | ICD-10-CM | POA: Diagnosis not present

## 2019-12-30 DIAGNOSIS — H43813 Vitreous degeneration, bilateral: Secondary | ICD-10-CM | POA: Diagnosis not present

## 2020-01-17 DIAGNOSIS — J029 Acute pharyngitis, unspecified: Secondary | ICD-10-CM | POA: Diagnosis not present

## 2020-01-17 DIAGNOSIS — R0981 Nasal congestion: Secondary | ICD-10-CM | POA: Diagnosis not present

## 2020-01-17 DIAGNOSIS — Z03818 Encounter for observation for suspected exposure to other biological agents ruled out: Secondary | ICD-10-CM | POA: Diagnosis not present

## 2020-01-28 DIAGNOSIS — J019 Acute sinusitis, unspecified: Secondary | ICD-10-CM | POA: Diagnosis not present

## 2020-02-05 DIAGNOSIS — R635 Abnormal weight gain: Secondary | ICD-10-CM | POA: Diagnosis not present

## 2020-03-21 DIAGNOSIS — Z1231 Encounter for screening mammogram for malignant neoplasm of breast: Secondary | ICD-10-CM | POA: Diagnosis not present

## 2020-03-29 DIAGNOSIS — M8580 Other specified disorders of bone density and structure, unspecified site: Secondary | ICD-10-CM | POA: Diagnosis not present

## 2020-03-29 DIAGNOSIS — E785 Hyperlipidemia, unspecified: Secondary | ICD-10-CM | POA: Diagnosis not present

## 2020-04-06 DIAGNOSIS — Z23 Encounter for immunization: Secondary | ICD-10-CM | POA: Diagnosis not present

## 2020-04-12 ENCOUNTER — Other Ambulatory Visit: Payer: Self-pay | Admitting: Obstetrics & Gynecology

## 2020-04-12 DIAGNOSIS — M8580 Other specified disorders of bone density and structure, unspecified site: Secondary | ICD-10-CM

## 2020-04-13 DIAGNOSIS — L821 Other seborrheic keratosis: Secondary | ICD-10-CM | POA: Diagnosis not present

## 2020-06-09 DIAGNOSIS — F411 Generalized anxiety disorder: Secondary | ICD-10-CM | POA: Diagnosis not present

## 2020-06-09 DIAGNOSIS — Z1159 Encounter for screening for other viral diseases: Secondary | ICD-10-CM | POA: Diagnosis not present

## 2020-06-09 DIAGNOSIS — Z0000 Encounter for general adult medical examination without abnormal findings: Secondary | ICD-10-CM | POA: Diagnosis not present

## 2020-06-09 DIAGNOSIS — Z1389 Encounter for screening for other disorder: Secondary | ICD-10-CM | POA: Diagnosis not present

## 2020-06-09 DIAGNOSIS — E785 Hyperlipidemia, unspecified: Secondary | ICD-10-CM | POA: Diagnosis not present

## 2020-06-09 DIAGNOSIS — M8588 Other specified disorders of bone density and structure, other site: Secondary | ICD-10-CM | POA: Diagnosis not present

## 2020-06-16 DIAGNOSIS — H9113 Presbycusis, bilateral: Secondary | ICD-10-CM | POA: Diagnosis not present

## 2020-06-28 DIAGNOSIS — R635 Abnormal weight gain: Secondary | ICD-10-CM | POA: Diagnosis not present

## 2020-06-28 DIAGNOSIS — Z6823 Body mass index (BMI) 23.0-23.9, adult: Secondary | ICD-10-CM | POA: Diagnosis not present

## 2020-06-28 DIAGNOSIS — E785 Hyperlipidemia, unspecified: Secondary | ICD-10-CM | POA: Diagnosis not present

## 2020-07-25 ENCOUNTER — Other Ambulatory Visit: Payer: Self-pay

## 2020-07-25 ENCOUNTER — Ambulatory Visit
Admission: RE | Admit: 2020-07-25 | Discharge: 2020-07-25 | Disposition: A | Discharge status: Home / Self Care | Payer: Medicare Other | Source: Ambulatory Visit | Attending: Obstetrics & Gynecology | Admitting: Obstetrics & Gynecology

## 2020-07-25 DIAGNOSIS — Z780 Asymptomatic menopausal state: Secondary | ICD-10-CM | POA: Diagnosis not present

## 2020-07-25 DIAGNOSIS — M8588 Other specified disorders of bone density and structure, other site: Secondary | ICD-10-CM | POA: Diagnosis not present

## 2020-07-25 DIAGNOSIS — Z8262 Family history of osteoporosis: Secondary | ICD-10-CM | POA: Diagnosis not present

## 2020-07-25 DIAGNOSIS — M8580 Other specified disorders of bone density and structure, unspecified site: Secondary | ICD-10-CM

## 2020-07-27 DIAGNOSIS — Z124 Encounter for screening for malignant neoplasm of cervix: Secondary | ICD-10-CM | POA: Diagnosis not present

## 2020-07-27 DIAGNOSIS — Z01411 Encounter for gynecological examination (general) (routine) with abnormal findings: Secondary | ICD-10-CM | POA: Diagnosis not present

## 2020-07-27 DIAGNOSIS — Z6823 Body mass index (BMI) 23.0-23.9, adult: Secondary | ICD-10-CM | POA: Diagnosis not present

## 2020-07-27 DIAGNOSIS — Z01419 Encounter for gynecological examination (general) (routine) without abnormal findings: Secondary | ICD-10-CM | POA: Diagnosis not present

## 2020-07-27 DIAGNOSIS — R6882 Decreased libido: Secondary | ICD-10-CM | POA: Diagnosis not present

## 2020-07-27 DIAGNOSIS — N952 Postmenopausal atrophic vaginitis: Secondary | ICD-10-CM | POA: Diagnosis not present

## 2020-07-27 DIAGNOSIS — M8580 Other specified disorders of bone density and structure, unspecified site: Secondary | ICD-10-CM | POA: Diagnosis not present

## 2020-09-30 DIAGNOSIS — E785 Hyperlipidemia, unspecified: Secondary | ICD-10-CM | POA: Diagnosis not present

## 2020-09-30 DIAGNOSIS — M8588 Other specified disorders of bone density and structure, other site: Secondary | ICD-10-CM | POA: Diagnosis not present

## 2020-09-30 DIAGNOSIS — E663 Overweight: Secondary | ICD-10-CM | POA: Diagnosis not present

## 2020-10-06 DIAGNOSIS — F411 Generalized anxiety disorder: Secondary | ICD-10-CM | POA: Diagnosis not present

## 2020-10-13 DIAGNOSIS — M542 Cervicalgia: Secondary | ICD-10-CM | POA: Diagnosis not present

## 2020-10-13 DIAGNOSIS — M4722 Other spondylosis with radiculopathy, cervical region: Secondary | ICD-10-CM | POA: Diagnosis not present

## 2020-11-01 DIAGNOSIS — F411 Generalized anxiety disorder: Secondary | ICD-10-CM | POA: Diagnosis not present

## 2020-11-08 DIAGNOSIS — Z23 Encounter for immunization: Secondary | ICD-10-CM | POA: Diagnosis not present

## 2020-12-13 DIAGNOSIS — L578 Other skin changes due to chronic exposure to nonionizing radiation: Secondary | ICD-10-CM | POA: Diagnosis not present

## 2020-12-13 DIAGNOSIS — B351 Tinea unguium: Secondary | ICD-10-CM | POA: Diagnosis not present

## 2020-12-13 DIAGNOSIS — L821 Other seborrheic keratosis: Secondary | ICD-10-CM | POA: Diagnosis not present

## 2020-12-30 DIAGNOSIS — F419 Anxiety disorder, unspecified: Secondary | ICD-10-CM | POA: Diagnosis not present

## 2020-12-30 DIAGNOSIS — E785 Hyperlipidemia, unspecified: Secondary | ICD-10-CM | POA: Diagnosis not present

## 2021-01-02 DIAGNOSIS — H43813 Vitreous degeneration, bilateral: Secondary | ICD-10-CM | POA: Diagnosis not present

## 2021-01-02 DIAGNOSIS — H35373 Puckering of macula, bilateral: Secondary | ICD-10-CM | POA: Diagnosis not present

## 2021-01-02 DIAGNOSIS — H2513 Age-related nuclear cataract, bilateral: Secondary | ICD-10-CM | POA: Diagnosis not present

## 2021-01-12 DIAGNOSIS — F411 Generalized anxiety disorder: Secondary | ICD-10-CM | POA: Diagnosis not present

## 2021-01-12 DIAGNOSIS — R632 Polyphagia: Secondary | ICD-10-CM | POA: Diagnosis not present

## 2021-02-28 DIAGNOSIS — M5440 Lumbago with sciatica, unspecified side: Secondary | ICD-10-CM | POA: Diagnosis not present

## 2021-03-02 DIAGNOSIS — M5416 Radiculopathy, lumbar region: Secondary | ICD-10-CM | POA: Diagnosis not present

## 2021-03-02 DIAGNOSIS — M62451 Contracture of muscle, right thigh: Secondary | ICD-10-CM | POA: Diagnosis not present

## 2021-03-02 DIAGNOSIS — M62452 Contracture of muscle, left thigh: Secondary | ICD-10-CM | POA: Diagnosis not present

## 2021-03-13 DIAGNOSIS — Z20828 Contact with and (suspected) exposure to other viral communicable diseases: Secondary | ICD-10-CM | POA: Diagnosis not present

## 2021-03-13 DIAGNOSIS — Z20822 Contact with and (suspected) exposure to covid-19: Secondary | ICD-10-CM | POA: Diagnosis not present

## 2021-03-14 DIAGNOSIS — M545 Low back pain, unspecified: Secondary | ICD-10-CM | POA: Diagnosis not present

## 2021-03-14 DIAGNOSIS — M5440 Lumbago with sciatica, unspecified side: Secondary | ICD-10-CM | POA: Diagnosis not present

## 2021-03-16 DIAGNOSIS — K1329 Other disturbances of oral epithelium, including tongue: Secondary | ICD-10-CM | POA: Diagnosis not present

## 2021-03-21 DIAGNOSIS — M5440 Lumbago with sciatica, unspecified side: Secondary | ICD-10-CM | POA: Diagnosis not present

## 2021-03-21 DIAGNOSIS — M5416 Radiculopathy, lumbar region: Secondary | ICD-10-CM | POA: Diagnosis not present

## 2021-03-23 DIAGNOSIS — Z1231 Encounter for screening mammogram for malignant neoplasm of breast: Secondary | ICD-10-CM | POA: Diagnosis not present

## 2021-03-24 DIAGNOSIS — M5416 Radiculopathy, lumbar region: Secondary | ICD-10-CM | POA: Diagnosis not present

## 2021-03-28 DIAGNOSIS — M79604 Pain in right leg: Secondary | ICD-10-CM | POA: Diagnosis not present

## 2021-03-28 DIAGNOSIS — M5416 Radiculopathy, lumbar region: Secondary | ICD-10-CM | POA: Diagnosis not present

## 2021-03-28 DIAGNOSIS — M79605 Pain in left leg: Secondary | ICD-10-CM | POA: Diagnosis not present

## 2021-03-31 DIAGNOSIS — M5416 Radiculopathy, lumbar region: Secondary | ICD-10-CM | POA: Diagnosis not present

## 2021-04-04 DIAGNOSIS — M79605 Pain in left leg: Secondary | ICD-10-CM | POA: Diagnosis not present

## 2021-04-04 DIAGNOSIS — M79604 Pain in right leg: Secondary | ICD-10-CM | POA: Diagnosis not present

## 2021-04-04 DIAGNOSIS — M5416 Radiculopathy, lumbar region: Secondary | ICD-10-CM | POA: Diagnosis not present

## 2021-04-06 DIAGNOSIS — M79605 Pain in left leg: Secondary | ICD-10-CM | POA: Diagnosis not present

## 2021-04-06 DIAGNOSIS — M5416 Radiculopathy, lumbar region: Secondary | ICD-10-CM | POA: Diagnosis not present

## 2021-04-06 DIAGNOSIS — M79604 Pain in right leg: Secondary | ICD-10-CM | POA: Diagnosis not present

## 2021-04-10 DIAGNOSIS — M5416 Radiculopathy, lumbar region: Secondary | ICD-10-CM | POA: Diagnosis not present

## 2021-04-10 DIAGNOSIS — M79604 Pain in right leg: Secondary | ICD-10-CM | POA: Diagnosis not present

## 2021-04-10 DIAGNOSIS — M79605 Pain in left leg: Secondary | ICD-10-CM | POA: Diagnosis not present

## 2021-04-12 DIAGNOSIS — M5416 Radiculopathy, lumbar region: Secondary | ICD-10-CM | POA: Diagnosis not present

## 2021-04-13 DIAGNOSIS — K1329 Other disturbances of oral epithelium, including tongue: Secondary | ICD-10-CM | POA: Diagnosis not present

## 2021-04-13 DIAGNOSIS — K1370 Unspecified lesions of oral mucosa: Secondary | ICD-10-CM | POA: Diagnosis not present

## 2021-04-19 DIAGNOSIS — M5416 Radiculopathy, lumbar region: Secondary | ICD-10-CM | POA: Diagnosis not present

## 2021-04-20 DIAGNOSIS — M5416 Radiculopathy, lumbar region: Secondary | ICD-10-CM | POA: Diagnosis not present

## 2021-04-25 DIAGNOSIS — E785 Hyperlipidemia, unspecified: Secondary | ICD-10-CM | POA: Diagnosis not present

## 2021-04-26 DIAGNOSIS — M79604 Pain in right leg: Secondary | ICD-10-CM | POA: Diagnosis not present

## 2021-04-26 DIAGNOSIS — M5416 Radiculopathy, lumbar region: Secondary | ICD-10-CM | POA: Diagnosis not present

## 2021-04-26 DIAGNOSIS — M79605 Pain in left leg: Secondary | ICD-10-CM | POA: Diagnosis not present

## 2021-05-01 DIAGNOSIS — M79605 Pain in left leg: Secondary | ICD-10-CM | POA: Diagnosis not present

## 2021-05-01 DIAGNOSIS — M79604 Pain in right leg: Secondary | ICD-10-CM | POA: Diagnosis not present

## 2021-05-01 DIAGNOSIS — M5416 Radiculopathy, lumbar region: Secondary | ICD-10-CM | POA: Diagnosis not present

## 2021-05-05 DIAGNOSIS — M79604 Pain in right leg: Secondary | ICD-10-CM | POA: Diagnosis not present

## 2021-05-05 DIAGNOSIS — M5416 Radiculopathy, lumbar region: Secondary | ICD-10-CM | POA: Diagnosis not present

## 2021-05-05 DIAGNOSIS — M79605 Pain in left leg: Secondary | ICD-10-CM | POA: Diagnosis not present

## 2021-05-12 DIAGNOSIS — Z23 Encounter for immunization: Secondary | ICD-10-CM | POA: Diagnosis not present

## 2021-05-15 DIAGNOSIS — M79605 Pain in left leg: Secondary | ICD-10-CM | POA: Diagnosis not present

## 2021-05-15 DIAGNOSIS — M79604 Pain in right leg: Secondary | ICD-10-CM | POA: Diagnosis not present

## 2021-05-15 DIAGNOSIS — M5416 Radiculopathy, lumbar region: Secondary | ICD-10-CM | POA: Diagnosis not present

## 2021-05-19 DIAGNOSIS — M79604 Pain in right leg: Secondary | ICD-10-CM | POA: Diagnosis not present

## 2021-05-19 DIAGNOSIS — M5416 Radiculopathy, lumbar region: Secondary | ICD-10-CM | POA: Diagnosis not present

## 2021-05-19 DIAGNOSIS — M62452 Contracture of muscle, left thigh: Secondary | ICD-10-CM | POA: Diagnosis not present

## 2021-05-19 DIAGNOSIS — M79605 Pain in left leg: Secondary | ICD-10-CM | POA: Diagnosis not present

## 2021-05-19 DIAGNOSIS — M62451 Contracture of muscle, right thigh: Secondary | ICD-10-CM | POA: Diagnosis not present

## 2021-05-30 DIAGNOSIS — M47816 Spondylosis without myelopathy or radiculopathy, lumbar region: Secondary | ICD-10-CM | POA: Diagnosis not present

## 2021-05-31 DIAGNOSIS — M79604 Pain in right leg: Secondary | ICD-10-CM | POA: Diagnosis not present

## 2021-05-31 DIAGNOSIS — M5416 Radiculopathy, lumbar region: Secondary | ICD-10-CM | POA: Diagnosis not present

## 2021-05-31 DIAGNOSIS — M79605 Pain in left leg: Secondary | ICD-10-CM | POA: Diagnosis not present

## 2021-06-12 DIAGNOSIS — M5416 Radiculopathy, lumbar region: Secondary | ICD-10-CM | POA: Diagnosis not present

## 2021-06-14 DIAGNOSIS — M5416 Radiculopathy, lumbar region: Secondary | ICD-10-CM | POA: Diagnosis not present

## 2021-06-14 DIAGNOSIS — M8580 Other specified disorders of bone density and structure, unspecified site: Secondary | ICD-10-CM | POA: Diagnosis not present

## 2021-06-14 DIAGNOSIS — Z0000 Encounter for general adult medical examination without abnormal findings: Secondary | ICD-10-CM | POA: Diagnosis not present

## 2021-06-14 DIAGNOSIS — M79604 Pain in right leg: Secondary | ICD-10-CM | POA: Diagnosis not present

## 2021-06-14 DIAGNOSIS — E785 Hyperlipidemia, unspecified: Secondary | ICD-10-CM | POA: Diagnosis not present

## 2021-06-14 DIAGNOSIS — F411 Generalized anxiety disorder: Secondary | ICD-10-CM | POA: Diagnosis not present

## 2021-06-14 DIAGNOSIS — Z23 Encounter for immunization: Secondary | ICD-10-CM | POA: Diagnosis not present

## 2021-06-14 DIAGNOSIS — M79605 Pain in left leg: Secondary | ICD-10-CM | POA: Diagnosis not present

## 2021-06-14 DIAGNOSIS — Z1389 Encounter for screening for other disorder: Secondary | ICD-10-CM | POA: Diagnosis not present

## 2021-06-22 DIAGNOSIS — M4807 Spinal stenosis, lumbosacral region: Secondary | ICD-10-CM | POA: Diagnosis not present

## 2021-06-22 DIAGNOSIS — M47816 Spondylosis without myelopathy or radiculopathy, lumbar region: Secondary | ICD-10-CM | POA: Diagnosis not present

## 2021-06-22 DIAGNOSIS — M4316 Spondylolisthesis, lumbar region: Secondary | ICD-10-CM | POA: Diagnosis not present

## 2021-06-23 DIAGNOSIS — M79604 Pain in right leg: Secondary | ICD-10-CM | POA: Diagnosis not present

## 2021-06-23 DIAGNOSIS — H9113 Presbycusis, bilateral: Secondary | ICD-10-CM | POA: Diagnosis not present

## 2021-06-23 DIAGNOSIS — M79605 Pain in left leg: Secondary | ICD-10-CM | POA: Diagnosis not present

## 2021-06-23 DIAGNOSIS — M5416 Radiculopathy, lumbar region: Secondary | ICD-10-CM | POA: Diagnosis not present

## 2021-07-03 DIAGNOSIS — H35373 Puckering of macula, bilateral: Secondary | ICD-10-CM | POA: Diagnosis not present

## 2021-07-03 DIAGNOSIS — H43813 Vitreous degeneration, bilateral: Secondary | ICD-10-CM | POA: Diagnosis not present

## 2021-07-03 DIAGNOSIS — H2513 Age-related nuclear cataract, bilateral: Secondary | ICD-10-CM | POA: Diagnosis not present

## 2021-07-03 DIAGNOSIS — H04211 Epiphora due to excess lacrimation, right lacrimal gland: Secondary | ICD-10-CM | POA: Diagnosis not present

## 2021-07-13 DIAGNOSIS — R0989 Other specified symptoms and signs involving the circulatory and respiratory systems: Secondary | ICD-10-CM | POA: Diagnosis not present

## 2021-07-13 DIAGNOSIS — R0602 Shortness of breath: Secondary | ICD-10-CM | POA: Diagnosis not present

## 2021-07-13 DIAGNOSIS — R059 Cough, unspecified: Secondary | ICD-10-CM | POA: Diagnosis not present

## 2021-07-13 DIAGNOSIS — R519 Headache, unspecified: Secondary | ICD-10-CM | POA: Diagnosis not present

## 2021-07-13 DIAGNOSIS — R6889 Other general symptoms and signs: Secondary | ICD-10-CM | POA: Diagnosis not present

## 2021-07-13 DIAGNOSIS — Z20822 Contact with and (suspected) exposure to covid-19: Secondary | ICD-10-CM | POA: Diagnosis not present

## 2021-07-22 DIAGNOSIS — R051 Acute cough: Secondary | ICD-10-CM | POA: Diagnosis not present

## 2021-07-25 DIAGNOSIS — J209 Acute bronchitis, unspecified: Secondary | ICD-10-CM | POA: Diagnosis not present

## 2021-07-25 DIAGNOSIS — H9113 Presbycusis, bilateral: Secondary | ICD-10-CM | POA: Diagnosis not present

## 2021-08-24 DIAGNOSIS — M4316 Spondylolisthesis, lumbar region: Secondary | ICD-10-CM | POA: Diagnosis not present

## 2021-08-24 DIAGNOSIS — G4719 Other hypersomnia: Secondary | ICD-10-CM | POA: Diagnosis not present

## 2021-08-24 DIAGNOSIS — R0681 Apnea, not elsewhere classified: Secondary | ICD-10-CM | POA: Diagnosis not present

## 2021-08-25 DIAGNOSIS — E663 Overweight: Secondary | ICD-10-CM | POA: Diagnosis not present

## 2021-08-25 DIAGNOSIS — G4700 Insomnia, unspecified: Secondary | ICD-10-CM | POA: Diagnosis not present

## 2021-08-25 DIAGNOSIS — F32A Depression, unspecified: Secondary | ICD-10-CM | POA: Diagnosis not present

## 2021-08-25 DIAGNOSIS — E785 Hyperlipidemia, unspecified: Secondary | ICD-10-CM | POA: Diagnosis not present

## 2021-09-12 DIAGNOSIS — G4733 Obstructive sleep apnea (adult) (pediatric): Secondary | ICD-10-CM | POA: Diagnosis not present

## 2021-09-21 DIAGNOSIS — M47816 Spondylosis without myelopathy or radiculopathy, lumbar region: Secondary | ICD-10-CM | POA: Diagnosis not present

## 2021-10-12 DIAGNOSIS — M47816 Spondylosis without myelopathy or radiculopathy, lumbar region: Secondary | ICD-10-CM | POA: Diagnosis not present

## 2021-10-27 DIAGNOSIS — F418 Other specified anxiety disorders: Secondary | ICD-10-CM | POA: Diagnosis not present

## 2021-10-27 DIAGNOSIS — E663 Overweight: Secondary | ICD-10-CM | POA: Diagnosis not present

## 2021-11-09 DIAGNOSIS — M47816 Spondylosis without myelopathy or radiculopathy, lumbar region: Secondary | ICD-10-CM | POA: Diagnosis not present

## 2021-11-20 DIAGNOSIS — J069 Acute upper respiratory infection, unspecified: Secondary | ICD-10-CM | POA: Diagnosis not present

## 2021-11-20 DIAGNOSIS — R059 Cough, unspecified: Secondary | ICD-10-CM | POA: Diagnosis not present

## 2021-11-22 ENCOUNTER — Ambulatory Visit
Admission: RE | Admit: 2021-11-22 | Discharge: 2021-11-22 | Disposition: A | Discharge status: Home / Self Care | Payer: Medicare Other | Source: Ambulatory Visit | Attending: Family Medicine | Admitting: Family Medicine

## 2021-11-22 ENCOUNTER — Other Ambulatory Visit: Payer: Self-pay | Admitting: Family Medicine

## 2021-11-22 DIAGNOSIS — R059 Cough, unspecified: Secondary | ICD-10-CM

## 2021-11-30 DIAGNOSIS — M4316 Spondylolisthesis, lumbar region: Secondary | ICD-10-CM | POA: Diagnosis not present

## 2021-11-30 DIAGNOSIS — M47816 Spondylosis without myelopathy or radiculopathy, lumbar region: Secondary | ICD-10-CM | POA: Diagnosis not present

## 2021-12-01 DIAGNOSIS — Z20822 Contact with and (suspected) exposure to covid-19: Secondary | ICD-10-CM | POA: Diagnosis not present

## 2021-12-07 DIAGNOSIS — J3489 Other specified disorders of nose and nasal sinuses: Secondary | ICD-10-CM | POA: Diagnosis not present

## 2021-12-07 DIAGNOSIS — Z974 Presence of external hearing-aid: Secondary | ICD-10-CM | POA: Diagnosis not present

## 2021-12-07 DIAGNOSIS — R053 Chronic cough: Secondary | ICD-10-CM | POA: Diagnosis not present

## 2021-12-07 DIAGNOSIS — J383 Other diseases of vocal cords: Secondary | ICD-10-CM | POA: Diagnosis not present

## 2021-12-07 DIAGNOSIS — Z9889 Other specified postprocedural states: Secondary | ICD-10-CM | POA: Diagnosis not present

## 2021-12-08 DIAGNOSIS — J342 Deviated nasal septum: Secondary | ICD-10-CM | POA: Diagnosis not present

## 2021-12-08 DIAGNOSIS — J3489 Other specified disorders of nose and nasal sinuses: Secondary | ICD-10-CM | POA: Diagnosis not present

## 2021-12-08 DIAGNOSIS — R053 Chronic cough: Secondary | ICD-10-CM | POA: Diagnosis not present

## 2021-12-11 DIAGNOSIS — M47816 Spondylosis without myelopathy or radiculopathy, lumbar region: Secondary | ICD-10-CM | POA: Diagnosis not present

## 2021-12-13 DIAGNOSIS — Z20822 Contact with and (suspected) exposure to covid-19: Secondary | ICD-10-CM | POA: Diagnosis not present

## 2021-12-14 DIAGNOSIS — L814 Other melanin hyperpigmentation: Secondary | ICD-10-CM | POA: Diagnosis not present

## 2021-12-14 DIAGNOSIS — B353 Tinea pedis: Secondary | ICD-10-CM | POA: Diagnosis not present

## 2021-12-14 DIAGNOSIS — L821 Other seborrheic keratosis: Secondary | ICD-10-CM | POA: Diagnosis not present

## 2021-12-14 DIAGNOSIS — D171 Benign lipomatous neoplasm of skin and subcutaneous tissue of trunk: Secondary | ICD-10-CM | POA: Diagnosis not present

## 2021-12-14 DIAGNOSIS — L578 Other skin changes due to chronic exposure to nonionizing radiation: Secondary | ICD-10-CM | POA: Diagnosis not present

## 2021-12-14 DIAGNOSIS — B351 Tinea unguium: Secondary | ICD-10-CM | POA: Diagnosis not present

## 2021-12-28 DIAGNOSIS — Z974 Presence of external hearing-aid: Secondary | ICD-10-CM | POA: Diagnosis not present

## 2021-12-28 DIAGNOSIS — J382 Nodules of vocal cords: Secondary | ICD-10-CM | POA: Diagnosis not present

## 2021-12-28 DIAGNOSIS — Z8709 Personal history of other diseases of the respiratory system: Secondary | ICD-10-CM | POA: Diagnosis not present

## 2021-12-28 DIAGNOSIS — J3489 Other specified disorders of nose and nasal sinuses: Secondary | ICD-10-CM | POA: Diagnosis not present

## 2021-12-29 DIAGNOSIS — R635 Abnormal weight gain: Secondary | ICD-10-CM | POA: Diagnosis not present

## 2021-12-29 DIAGNOSIS — E785 Hyperlipidemia, unspecified: Secondary | ICD-10-CM | POA: Diagnosis not present

## 2022-01-03 DIAGNOSIS — H43813 Vitreous degeneration, bilateral: Secondary | ICD-10-CM | POA: Diagnosis not present

## 2022-01-03 DIAGNOSIS — H2513 Age-related nuclear cataract, bilateral: Secondary | ICD-10-CM | POA: Diagnosis not present

## 2022-01-03 DIAGNOSIS — H35373 Puckering of macula, bilateral: Secondary | ICD-10-CM | POA: Diagnosis not present

## 2022-01-03 DIAGNOSIS — H04211 Epiphora due to excess lacrimation, right lacrimal gland: Secondary | ICD-10-CM | POA: Diagnosis not present

## 2022-01-05 DIAGNOSIS — Z23 Encounter for immunization: Secondary | ICD-10-CM | POA: Diagnosis not present

## 2022-01-11 DIAGNOSIS — D485 Neoplasm of uncertain behavior of skin: Secondary | ICD-10-CM | POA: Diagnosis not present

## 2022-01-11 DIAGNOSIS — H04542 Stenosis of left lacrimal canaliculi: Secondary | ICD-10-CM | POA: Diagnosis not present

## 2022-01-11 DIAGNOSIS — L814 Other melanin hyperpigmentation: Secondary | ICD-10-CM | POA: Diagnosis not present

## 2022-01-11 DIAGNOSIS — B351 Tinea unguium: Secondary | ICD-10-CM | POA: Diagnosis not present

## 2022-01-11 DIAGNOSIS — H04563 Stenosis of bilateral lacrimal punctum: Secondary | ICD-10-CM | POA: Diagnosis not present

## 2022-01-11 DIAGNOSIS — H02831 Dermatochalasis of right upper eyelid: Secondary | ICD-10-CM | POA: Diagnosis not present

## 2022-01-11 DIAGNOSIS — H04412 Chronic dacryocystitis of left lacrimal passage: Secondary | ICD-10-CM | POA: Diagnosis not present

## 2022-01-11 DIAGNOSIS — L249 Irritant contact dermatitis, unspecified cause: Secondary | ICD-10-CM | POA: Diagnosis not present

## 2022-01-11 DIAGNOSIS — H04223 Epiphora due to insufficient drainage, bilateral lacrimal glands: Secondary | ICD-10-CM | POA: Diagnosis not present

## 2022-01-11 DIAGNOSIS — H04411 Chronic dacryocystitis of right lacrimal passage: Secondary | ICD-10-CM | POA: Diagnosis not present

## 2022-01-24 DIAGNOSIS — R635 Abnormal weight gain: Secondary | ICD-10-CM | POA: Diagnosis not present

## 2022-01-24 DIAGNOSIS — R948 Abnormal results of function studies of other organs and systems: Secondary | ICD-10-CM | POA: Diagnosis not present

## 2022-02-16 DIAGNOSIS — H04561 Stenosis of right lacrimal punctum: Secondary | ICD-10-CM | POA: Diagnosis not present

## 2022-02-16 DIAGNOSIS — H04221 Epiphora due to insufficient drainage, right lacrimal gland: Secondary | ICD-10-CM | POA: Diagnosis not present

## 2022-03-08 DIAGNOSIS — M4316 Spondylolisthesis, lumbar region: Secondary | ICD-10-CM | POA: Diagnosis not present

## 2022-03-20 DIAGNOSIS — J382 Nodules of vocal cords: Secondary | ICD-10-CM | POA: Diagnosis not present

## 2022-03-20 DIAGNOSIS — R490 Dysphonia: Secondary | ICD-10-CM | POA: Diagnosis not present

## 2022-03-20 DIAGNOSIS — J384 Edema of larynx: Secondary | ICD-10-CM | POA: Diagnosis not present

## 2022-03-30 ENCOUNTER — Ambulatory Visit
Admission: RE | Admit: 2022-03-30 | Discharge: 2022-03-30 | Disposition: A | Discharge status: Home / Self Care | Payer: Medicare Other | Source: Ambulatory Visit | Attending: Family Medicine | Admitting: Family Medicine

## 2022-03-30 ENCOUNTER — Other Ambulatory Visit: Payer: Self-pay | Admitting: Family Medicine

## 2022-03-30 DIAGNOSIS — R0989 Other specified symptoms and signs involving the circulatory and respiratory systems: Secondary | ICD-10-CM

## 2022-03-30 DIAGNOSIS — J069 Acute upper respiratory infection, unspecified: Secondary | ICD-10-CM | POA: Diagnosis not present

## 2022-03-30 DIAGNOSIS — R058 Other specified cough: Secondary | ICD-10-CM

## 2022-03-30 DIAGNOSIS — R059 Cough, unspecified: Secondary | ICD-10-CM | POA: Diagnosis not present

## 2022-04-02 DIAGNOSIS — Z1231 Encounter for screening mammogram for malignant neoplasm of breast: Secondary | ICD-10-CM | POA: Diagnosis not present

## 2022-04-02 DIAGNOSIS — Z01411 Encounter for gynecological examination (general) (routine) with abnormal findings: Secondary | ICD-10-CM | POA: Diagnosis not present

## 2022-04-02 DIAGNOSIS — R6882 Decreased libido: Secondary | ICD-10-CM | POA: Diagnosis not present

## 2022-04-02 DIAGNOSIS — Z01419 Encounter for gynecological examination (general) (routine) without abnormal findings: Secondary | ICD-10-CM | POA: Diagnosis not present

## 2022-04-02 DIAGNOSIS — Z0142 Encounter for cervical smear to confirm findings of recent normal smear following initial abnormal smear: Secondary | ICD-10-CM | POA: Diagnosis not present

## 2022-04-02 DIAGNOSIS — Z124 Encounter for screening for malignant neoplasm of cervix: Secondary | ICD-10-CM | POA: Diagnosis not present

## 2022-04-02 DIAGNOSIS — N952 Postmenopausal atrophic vaginitis: Secondary | ICD-10-CM | POA: Diagnosis not present

## 2022-04-06 DIAGNOSIS — E669 Obesity, unspecified: Secondary | ICD-10-CM | POA: Diagnosis not present

## 2022-04-06 DIAGNOSIS — E785 Hyperlipidemia, unspecified: Secondary | ICD-10-CM | POA: Diagnosis not present

## 2022-04-06 DIAGNOSIS — Z8639 Personal history of other endocrine, nutritional and metabolic disease: Secondary | ICD-10-CM | POA: Diagnosis not present

## 2022-04-23 DIAGNOSIS — G4700 Insomnia, unspecified: Secondary | ICD-10-CM | POA: Diagnosis not present

## 2022-04-23 DIAGNOSIS — G4733 Obstructive sleep apnea (adult) (pediatric): Secondary | ICD-10-CM | POA: Diagnosis not present

## 2022-05-01 DIAGNOSIS — R490 Dysphonia: Secondary | ICD-10-CM | POA: Diagnosis not present

## 2022-05-01 DIAGNOSIS — J384 Edema of larynx: Secondary | ICD-10-CM | POA: Diagnosis not present

## 2022-05-07 DIAGNOSIS — Z23 Encounter for immunization: Secondary | ICD-10-CM | POA: Diagnosis not present

## 2022-05-21 DIAGNOSIS — G4733 Obstructive sleep apnea (adult) (pediatric): Secondary | ICD-10-CM | POA: Diagnosis not present

## 2022-06-04 DIAGNOSIS — Z23 Encounter for immunization: Secondary | ICD-10-CM | POA: Diagnosis not present

## 2022-06-05 DIAGNOSIS — R490 Dysphonia: Secondary | ICD-10-CM | POA: Diagnosis not present

## 2022-06-05 DIAGNOSIS — J384 Edema of larynx: Secondary | ICD-10-CM | POA: Diagnosis not present

## 2022-06-19 DIAGNOSIS — M4316 Spondylolisthesis, lumbar region: Secondary | ICD-10-CM | POA: Diagnosis not present

## 2022-06-28 DIAGNOSIS — J384 Edema of larynx: Secondary | ICD-10-CM | POA: Diagnosis not present

## 2022-06-28 DIAGNOSIS — R490 Dysphonia: Secondary | ICD-10-CM | POA: Diagnosis not present

## 2022-07-20 DIAGNOSIS — R051 Acute cough: Secondary | ICD-10-CM | POA: Diagnosis not present

## 2022-07-26 DIAGNOSIS — Z20828 Contact with and (suspected) exposure to other viral communicable diseases: Secondary | ICD-10-CM | POA: Diagnosis not present

## 2022-07-26 DIAGNOSIS — R058 Other specified cough: Secondary | ICD-10-CM | POA: Diagnosis not present

## 2022-07-26 DIAGNOSIS — J3489 Other specified disorders of nose and nasal sinuses: Secondary | ICD-10-CM | POA: Diagnosis not present

## 2022-09-04 DIAGNOSIS — M4316 Spondylolisthesis, lumbar region: Secondary | ICD-10-CM | POA: Diagnosis not present

## 2022-09-07 DIAGNOSIS — E785 Hyperlipidemia, unspecified: Secondary | ICD-10-CM | POA: Diagnosis not present

## 2022-09-28 DIAGNOSIS — G4700 Insomnia, unspecified: Secondary | ICD-10-CM | POA: Diagnosis not present

## 2022-09-28 DIAGNOSIS — E785 Hyperlipidemia, unspecified: Secondary | ICD-10-CM | POA: Diagnosis not present

## 2022-09-28 DIAGNOSIS — L309 Dermatitis, unspecified: Secondary | ICD-10-CM | POA: Diagnosis not present

## 2022-09-28 DIAGNOSIS — B351 Tinea unguium: Secondary | ICD-10-CM | POA: Diagnosis not present

## 2022-09-28 DIAGNOSIS — G4733 Obstructive sleep apnea (adult) (pediatric): Secondary | ICD-10-CM | POA: Diagnosis not present

## 2022-09-28 DIAGNOSIS — F411 Generalized anxiety disorder: Secondary | ICD-10-CM | POA: Diagnosis not present

## 2022-09-28 DIAGNOSIS — Z131 Encounter for screening for diabetes mellitus: Secondary | ICD-10-CM | POA: Diagnosis not present

## 2022-09-28 DIAGNOSIS — Z0000 Encounter for general adult medical examination without abnormal findings: Secondary | ICD-10-CM | POA: Diagnosis not present

## 2022-09-28 DIAGNOSIS — J382 Nodules of vocal cords: Secondary | ICD-10-CM | POA: Diagnosis not present

## 2022-09-28 DIAGNOSIS — M8580 Other specified disorders of bone density and structure, unspecified site: Secondary | ICD-10-CM | POA: Diagnosis not present

## 2022-09-28 DIAGNOSIS — N898 Other specified noninflammatory disorders of vagina: Secondary | ICD-10-CM | POA: Diagnosis not present

## 2022-10-03 DIAGNOSIS — R490 Dysphonia: Secondary | ICD-10-CM | POA: Diagnosis not present

## 2022-10-03 DIAGNOSIS — J384 Edema of larynx: Secondary | ICD-10-CM | POA: Diagnosis not present

## 2022-10-03 DIAGNOSIS — K219 Gastro-esophageal reflux disease without esophagitis: Secondary | ICD-10-CM | POA: Diagnosis not present

## 2022-10-03 DIAGNOSIS — J387 Other diseases of larynx: Secondary | ICD-10-CM | POA: Diagnosis not present

## 2022-11-01 DIAGNOSIS — Z23 Encounter for immunization: Secondary | ICD-10-CM | POA: Diagnosis not present

## 2022-11-18 DIAGNOSIS — J018 Other acute sinusitis: Secondary | ICD-10-CM | POA: Diagnosis not present

## 2022-11-27 DIAGNOSIS — Z83719 Family history of colon polyps, unspecified: Secondary | ICD-10-CM | POA: Diagnosis not present

## 2022-11-27 DIAGNOSIS — Z09 Encounter for follow-up examination after completed treatment for conditions other than malignant neoplasm: Secondary | ICD-10-CM | POA: Diagnosis not present

## 2022-12-02 IMAGING — CR DG CHEST 2V
2 series · 2 of 2 positions shown · non-contrast
Comparison: 01/03/2015

CLINICAL DATA: 70-year-old female with cough

EXAM:
CHEST - 2 VIEW

[w chest pa]
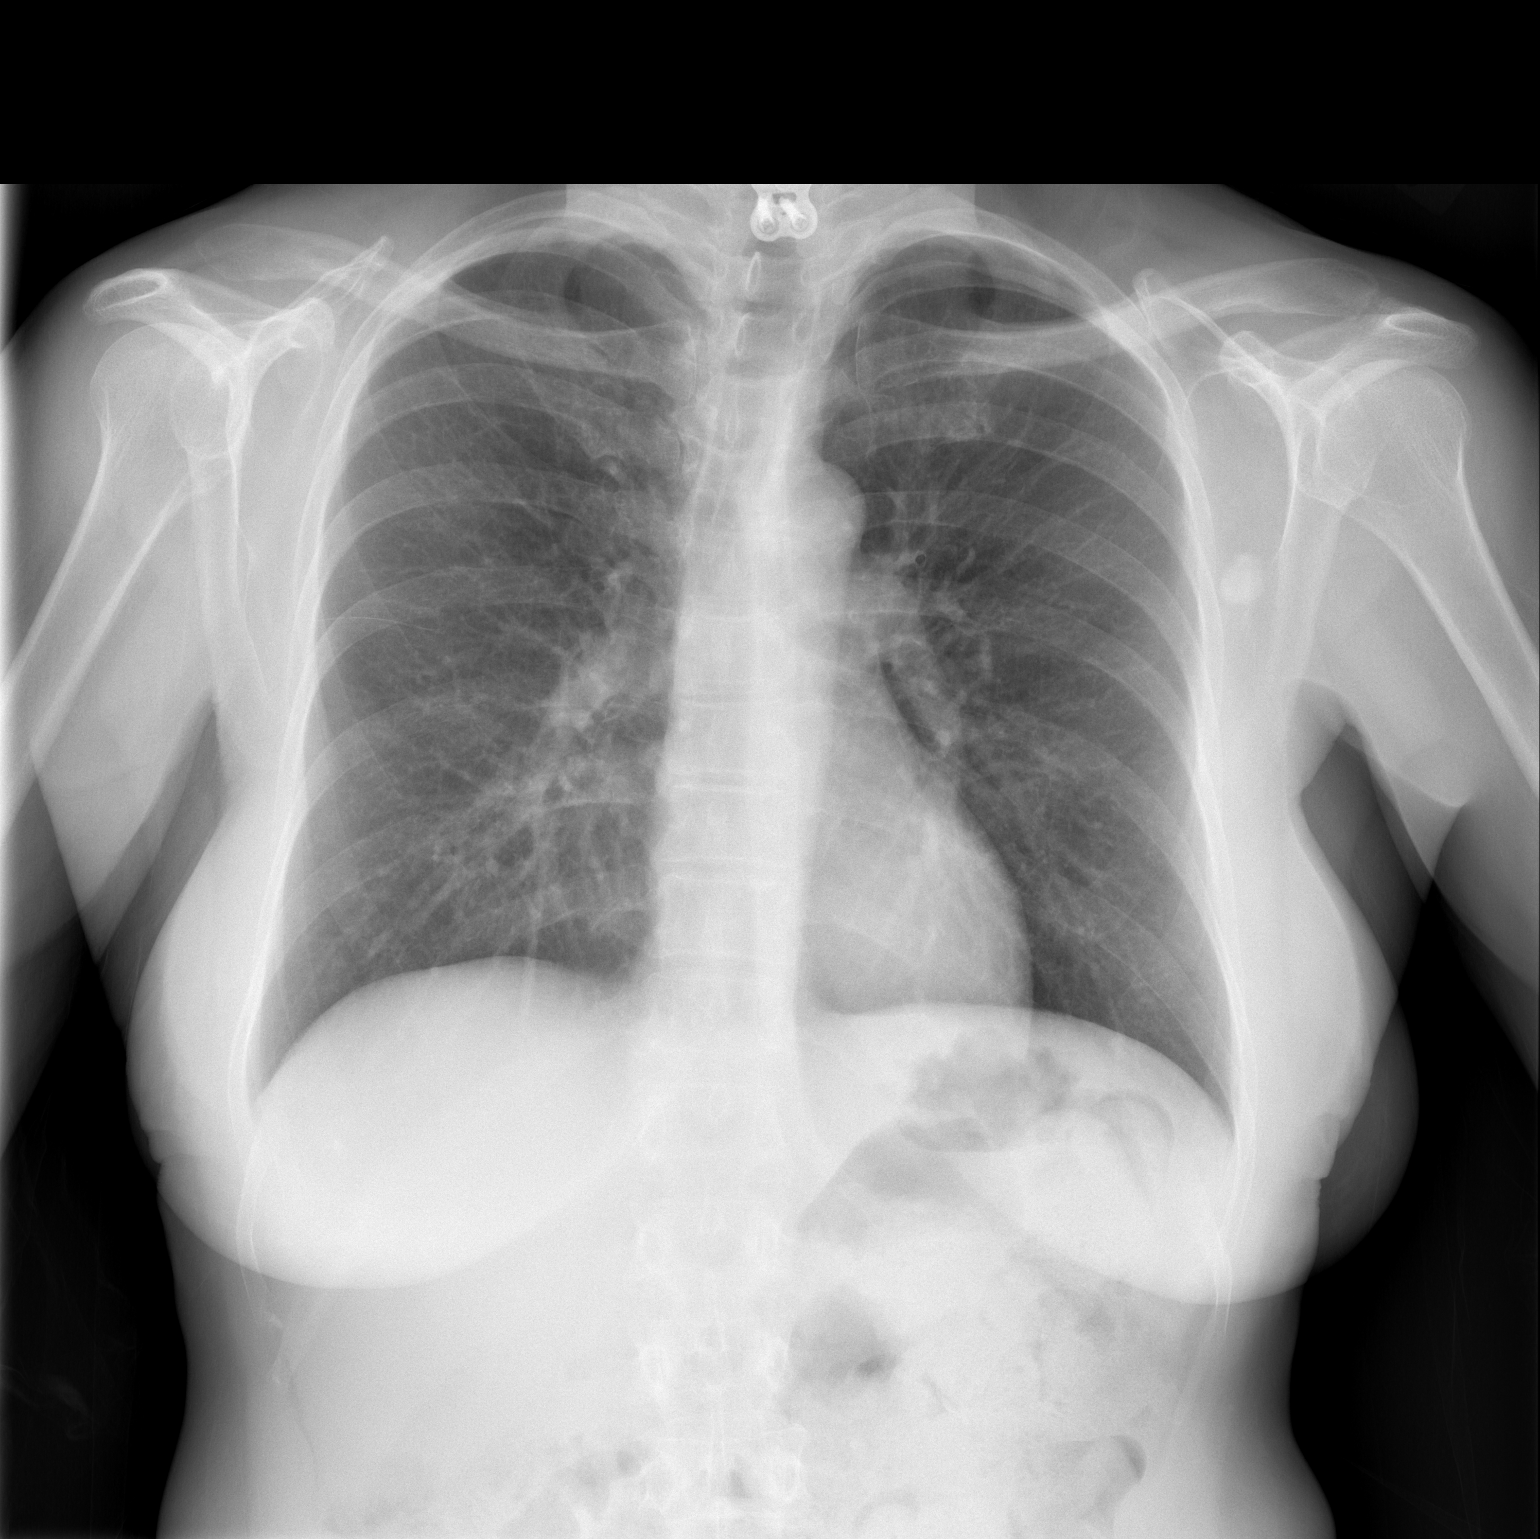

[w chest lat]
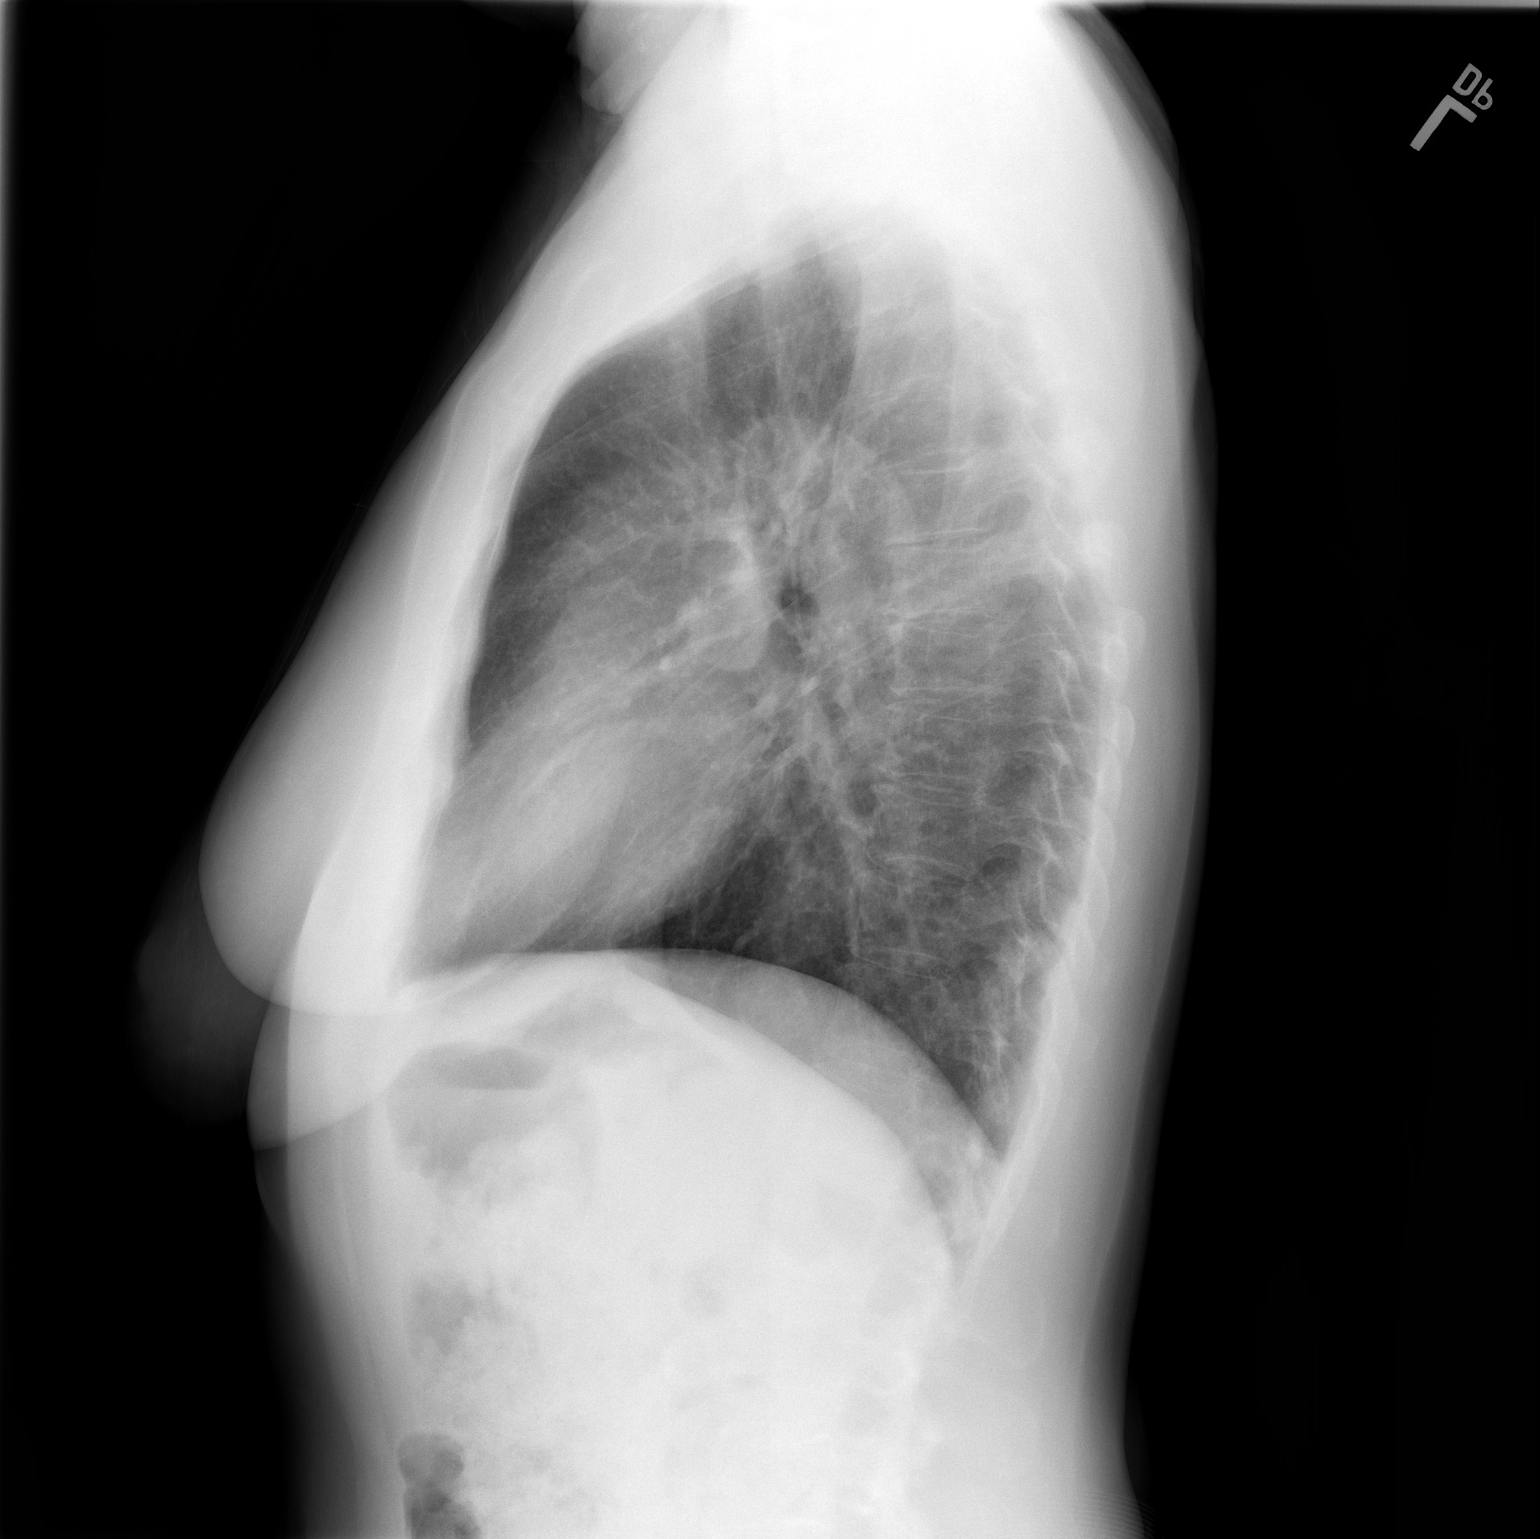

[2 of 2 positions shown; findings below may reference images not displayed]

FINDINGS: Cardiomediastinal silhouette unchanged in size and contour. No
evidence of central vascular congestion. No interlobular septal
thickening.

No pneumothorax or pleural effusion. Coarsened interstitial
markings, with no confluent airspace disease.

No acute displaced fracture. Degenerative changes of the spine.

Similar appearance of calcified nodule of the left axillary region
unchanged in size.

Interval surgical changes of the cervical region incompletely imaged
IMPRESSION: Negative for acute cardiopulmonary disease

## 2023-01-02 DIAGNOSIS — L578 Other skin changes due to chronic exposure to nonionizing radiation: Secondary | ICD-10-CM | POA: Diagnosis not present

## 2023-01-02 DIAGNOSIS — L821 Other seborrheic keratosis: Secondary | ICD-10-CM | POA: Diagnosis not present

## 2023-01-02 DIAGNOSIS — D2262 Melanocytic nevi of left upper limb, including shoulder: Secondary | ICD-10-CM | POA: Diagnosis not present

## 2023-01-02 DIAGNOSIS — L811 Chloasma: Secondary | ICD-10-CM | POA: Diagnosis not present

## 2023-01-02 DIAGNOSIS — D225 Melanocytic nevi of trunk: Secondary | ICD-10-CM | POA: Diagnosis not present

## 2023-01-02 DIAGNOSIS — D2239 Melanocytic nevi of other parts of face: Secondary | ICD-10-CM | POA: Diagnosis not present

## 2023-01-02 DIAGNOSIS — L814 Other melanin hyperpigmentation: Secondary | ICD-10-CM | POA: Diagnosis not present

## 2023-01-02 DIAGNOSIS — B351 Tinea unguium: Secondary | ICD-10-CM | POA: Diagnosis not present

## 2023-01-11 DIAGNOSIS — E785 Hyperlipidemia, unspecified: Secondary | ICD-10-CM | POA: Diagnosis not present

## 2023-01-11 DIAGNOSIS — Z8639 Personal history of other endocrine, nutritional and metabolic disease: Secondary | ICD-10-CM | POA: Diagnosis not present

## 2023-02-26 DIAGNOSIS — J019 Acute sinusitis, unspecified: Secondary | ICD-10-CM | POA: Diagnosis not present

## 2023-04-17 DIAGNOSIS — H04211 Epiphora due to excess lacrimation, right lacrimal gland: Secondary | ICD-10-CM | POA: Diagnosis not present

## 2023-04-17 DIAGNOSIS — H18513 Endothelial corneal dystrophy, bilateral: Secondary | ICD-10-CM | POA: Diagnosis not present

## 2023-04-17 DIAGNOSIS — H25813 Combined forms of age-related cataract, bilateral: Secondary | ICD-10-CM | POA: Diagnosis not present

## 2023-04-17 DIAGNOSIS — H35373 Puckering of macula, bilateral: Secondary | ICD-10-CM | POA: Diagnosis not present

## 2023-04-17 DIAGNOSIS — H43813 Vitreous degeneration, bilateral: Secondary | ICD-10-CM | POA: Diagnosis not present

## 2023-04-22 DIAGNOSIS — Z01419 Encounter for gynecological examination (general) (routine) without abnormal findings: Secondary | ICD-10-CM | POA: Diagnosis not present

## 2023-04-22 DIAGNOSIS — R490 Dysphonia: Secondary | ICD-10-CM | POA: Diagnosis not present

## 2023-04-22 DIAGNOSIS — Z124 Encounter for screening for malignant neoplasm of cervix: Secondary | ICD-10-CM | POA: Diagnosis not present

## 2023-04-22 DIAGNOSIS — Z1231 Encounter for screening mammogram for malignant neoplasm of breast: Secondary | ICD-10-CM | POA: Diagnosis not present

## 2023-04-22 DIAGNOSIS — Z01411 Encounter for gynecological examination (general) (routine) with abnormal findings: Secondary | ICD-10-CM | POA: Diagnosis not present

## 2023-04-22 DIAGNOSIS — F520 Hypoactive sexual desire disorder: Secondary | ICD-10-CM | POA: Diagnosis not present

## 2023-04-22 DIAGNOSIS — N952 Postmenopausal atrophic vaginitis: Secondary | ICD-10-CM | POA: Diagnosis not present

## 2023-04-22 DIAGNOSIS — Z1331 Encounter for screening for depression: Secondary | ICD-10-CM | POA: Diagnosis not present

## 2023-04-22 DIAGNOSIS — R498 Other voice and resonance disorders: Secondary | ICD-10-CM | POA: Diagnosis not present

## 2023-04-22 DIAGNOSIS — Z6824 Body mass index (BMI) 24.0-24.9, adult: Secondary | ICD-10-CM | POA: Diagnosis not present

## 2023-04-22 DIAGNOSIS — J384 Edema of larynx: Secondary | ICD-10-CM | POA: Diagnosis not present

## 2023-04-22 DIAGNOSIS — M8580 Other specified disorders of bone density and structure, unspecified site: Secondary | ICD-10-CM | POA: Diagnosis not present

## 2023-04-23 ENCOUNTER — Other Ambulatory Visit: Payer: Self-pay | Admitting: Obstetrics & Gynecology

## 2023-04-23 DIAGNOSIS — M8580 Other specified disorders of bone density and structure, unspecified site: Secondary | ICD-10-CM

## 2023-04-24 DIAGNOSIS — Z23 Encounter for immunization: Secondary | ICD-10-CM | POA: Diagnosis not present

## 2023-05-14 DIAGNOSIS — L409 Psoriasis, unspecified: Secondary | ICD-10-CM | POA: Diagnosis not present

## 2023-05-14 DIAGNOSIS — L820 Inflamed seborrheic keratosis: Secondary | ICD-10-CM | POA: Diagnosis not present

## 2023-05-14 DIAGNOSIS — L821 Other seborrheic keratosis: Secondary | ICD-10-CM | POA: Diagnosis not present

## 2023-05-21 DIAGNOSIS — H2511 Age-related nuclear cataract, right eye: Secondary | ICD-10-CM | POA: Diagnosis not present

## 2023-05-24 DIAGNOSIS — H903 Sensorineural hearing loss, bilateral: Secondary | ICD-10-CM | POA: Diagnosis not present

## 2023-05-27 DIAGNOSIS — Z23 Encounter for immunization: Secondary | ICD-10-CM | POA: Diagnosis not present

## 2023-05-29 DIAGNOSIS — H2512 Age-related nuclear cataract, left eye: Secondary | ICD-10-CM | POA: Diagnosis not present

## 2023-06-04 DIAGNOSIS — H2512 Age-related nuclear cataract, left eye: Secondary | ICD-10-CM | POA: Diagnosis not present

## 2023-06-21 DIAGNOSIS — F418 Other specified anxiety disorders: Secondary | ICD-10-CM | POA: Diagnosis not present

## 2023-06-21 DIAGNOSIS — E785 Hyperlipidemia, unspecified: Secondary | ICD-10-CM | POA: Diagnosis not present

## 2023-06-21 DIAGNOSIS — Z8639 Personal history of other endocrine, nutritional and metabolic disease: Secondary | ICD-10-CM | POA: Diagnosis not present

## 2023-07-24 DIAGNOSIS — G4733 Obstructive sleep apnea (adult) (pediatric): Secondary | ICD-10-CM | POA: Diagnosis not present

## 2023-08-20 DIAGNOSIS — G4733 Obstructive sleep apnea (adult) (pediatric): Secondary | ICD-10-CM | POA: Diagnosis not present

## 2023-09-12 DIAGNOSIS — Z8639 Personal history of other endocrine, nutritional and metabolic disease: Secondary | ICD-10-CM | POA: Diagnosis not present

## 2023-09-12 DIAGNOSIS — M8580 Other specified disorders of bone density and structure, unspecified site: Secondary | ICD-10-CM | POA: Diagnosis not present

## 2023-09-12 DIAGNOSIS — E782 Mixed hyperlipidemia: Secondary | ICD-10-CM | POA: Diagnosis not present

## 2023-09-12 DIAGNOSIS — F418 Other specified anxiety disorders: Secondary | ICD-10-CM | POA: Diagnosis not present

## 2023-09-12 DIAGNOSIS — Z13220 Encounter for screening for lipoid disorders: Secondary | ICD-10-CM | POA: Diagnosis not present

## 2023-10-07 DIAGNOSIS — J101 Influenza due to other identified influenza virus with other respiratory manifestations: Secondary | ICD-10-CM | POA: Diagnosis not present

## 2023-10-07 DIAGNOSIS — E785 Hyperlipidemia, unspecified: Secondary | ICD-10-CM | POA: Diagnosis not present

## 2023-10-07 DIAGNOSIS — R051 Acute cough: Secondary | ICD-10-CM | POA: Diagnosis not present

## 2023-10-07 DIAGNOSIS — J988 Other specified respiratory disorders: Secondary | ICD-10-CM | POA: Diagnosis not present

## 2023-10-24 DIAGNOSIS — B351 Tinea unguium: Secondary | ICD-10-CM | POA: Diagnosis not present

## 2023-10-24 DIAGNOSIS — L821 Other seborrheic keratosis: Secondary | ICD-10-CM | POA: Diagnosis not present

## 2023-10-25 DIAGNOSIS — E785 Hyperlipidemia, unspecified: Secondary | ICD-10-CM | POA: Diagnosis not present

## 2023-10-25 DIAGNOSIS — Z8639 Personal history of other endocrine, nutritional and metabolic disease: Secondary | ICD-10-CM | POA: Diagnosis not present

## 2023-10-31 ENCOUNTER — Other Ambulatory Visit: Payer: Medicare Other

## 2023-10-31 ENCOUNTER — Other Ambulatory Visit: Payer: Self-pay | Admitting: Obstetrics & Gynecology

## 2023-10-31 DIAGNOSIS — M8580 Other specified disorders of bone density and structure, unspecified site: Secondary | ICD-10-CM

## 2023-11-06 ENCOUNTER — Other Ambulatory Visit (HOSPITAL_COMMUNITY): Payer: Self-pay | Admitting: Internal Medicine

## 2023-11-06 DIAGNOSIS — Z136 Encounter for screening for cardiovascular disorders: Secondary | ICD-10-CM

## 2023-11-06 DIAGNOSIS — M8580 Other specified disorders of bone density and structure, unspecified site: Secondary | ICD-10-CM | POA: Diagnosis not present

## 2023-11-06 DIAGNOSIS — B351 Tinea unguium: Secondary | ICD-10-CM | POA: Diagnosis not present

## 2023-11-06 DIAGNOSIS — G4733 Obstructive sleep apnea (adult) (pediatric): Secondary | ICD-10-CM | POA: Diagnosis not present

## 2023-11-06 DIAGNOSIS — L309 Dermatitis, unspecified: Secondary | ICD-10-CM | POA: Diagnosis not present

## 2023-11-06 DIAGNOSIS — G4700 Insomnia, unspecified: Secondary | ICD-10-CM | POA: Diagnosis not present

## 2023-11-06 DIAGNOSIS — E663 Overweight: Secondary | ICD-10-CM | POA: Diagnosis not present

## 2023-11-06 DIAGNOSIS — F411 Generalized anxiety disorder: Secondary | ICD-10-CM | POA: Diagnosis not present

## 2023-11-06 DIAGNOSIS — N898 Other specified noninflammatory disorders of vagina: Secondary | ICD-10-CM | POA: Diagnosis not present

## 2023-11-06 DIAGNOSIS — E785 Hyperlipidemia, unspecified: Secondary | ICD-10-CM | POA: Diagnosis not present

## 2023-11-06 DIAGNOSIS — Z8249 Family history of ischemic heart disease and other diseases of the circulatory system: Secondary | ICD-10-CM | POA: Diagnosis not present

## 2023-11-06 DIAGNOSIS — J382 Nodules of vocal cords: Secondary | ICD-10-CM | POA: Diagnosis not present

## 2023-11-06 DIAGNOSIS — Z0000 Encounter for general adult medical examination without abnormal findings: Secondary | ICD-10-CM | POA: Diagnosis not present

## 2023-11-29 ENCOUNTER — Ambulatory Visit
Admission: RE | Admit: 2023-11-29 | Discharge: 2023-11-29 | Disposition: A | Discharge status: Home / Self Care | Source: Ambulatory Visit | Attending: Obstetrics & Gynecology | Admitting: Obstetrics & Gynecology

## 2023-11-29 DIAGNOSIS — M810 Age-related osteoporosis without current pathological fracture: Secondary | ICD-10-CM | POA: Diagnosis not present

## 2023-11-29 DIAGNOSIS — M8580 Other specified disorders of bone density and structure, unspecified site: Secondary | ICD-10-CM

## 2023-12-09 DIAGNOSIS — J384 Edema of larynx: Secondary | ICD-10-CM | POA: Diagnosis not present

## 2023-12-09 DIAGNOSIS — R490 Dysphonia: Secondary | ICD-10-CM | POA: Diagnosis not present

## 2023-12-09 DIAGNOSIS — R498 Other voice and resonance disorders: Secondary | ICD-10-CM | POA: Diagnosis not present

## 2023-12-11 DIAGNOSIS — F411 Generalized anxiety disorder: Secondary | ICD-10-CM | POA: Diagnosis not present

## 2023-12-11 DIAGNOSIS — E785 Hyperlipidemia, unspecified: Secondary | ICD-10-CM | POA: Diagnosis not present

## 2023-12-20 DIAGNOSIS — M8000XA Age-related osteoporosis with current pathological fracture, unspecified site, initial encounter for fracture: Secondary | ICD-10-CM | POA: Diagnosis not present

## 2023-12-20 DIAGNOSIS — S42302A Unspecified fracture of shaft of humerus, left arm, initial encounter for closed fracture: Secondary | ICD-10-CM | POA: Diagnosis not present

## 2023-12-23 DIAGNOSIS — M25522 Pain in left elbow: Secondary | ICD-10-CM | POA: Diagnosis not present

## 2023-12-23 DIAGNOSIS — M25531 Pain in right wrist: Secondary | ICD-10-CM | POA: Diagnosis not present

## 2023-12-23 DIAGNOSIS — M25521 Pain in right elbow: Secondary | ICD-10-CM | POA: Diagnosis not present

## 2023-12-23 DIAGNOSIS — S52532A Colles' fracture of left radius, initial encounter for closed fracture: Secondary | ICD-10-CM | POA: Diagnosis not present

## 2023-12-25 ENCOUNTER — Ambulatory Visit (HOSPITAL_BASED_OUTPATIENT_CLINIC_OR_DEPARTMENT_OTHER)
Admission: RE | Admit: 2023-12-25 | Discharge: 2023-12-25 | Disposition: A | Discharge status: Home / Self Care | Payer: Self-pay | Source: Ambulatory Visit | Attending: Internal Medicine | Admitting: Internal Medicine

## 2023-12-25 DIAGNOSIS — Z136 Encounter for screening for cardiovascular disorders: Secondary | ICD-10-CM | POA: Insufficient documentation

## 2023-12-31 DIAGNOSIS — S52572A Other intraarticular fracture of lower end of left radius, initial encounter for closed fracture: Secondary | ICD-10-CM | POA: Diagnosis not present

## 2023-12-31 DIAGNOSIS — X58XXXA Exposure to other specified factors, initial encounter: Secondary | ICD-10-CM | POA: Diagnosis not present

## 2023-12-31 DIAGNOSIS — Y999 Unspecified external cause status: Secondary | ICD-10-CM | POA: Diagnosis not present

## 2024-01-07 DIAGNOSIS — M810 Age-related osteoporosis without current pathological fracture: Secondary | ICD-10-CM | POA: Diagnosis not present

## 2024-01-11 DIAGNOSIS — E785 Hyperlipidemia, unspecified: Secondary | ICD-10-CM | POA: Diagnosis not present

## 2024-01-11 DIAGNOSIS — F411 Generalized anxiety disorder: Secondary | ICD-10-CM | POA: Diagnosis not present

## 2024-01-13 DIAGNOSIS — M25531 Pain in right wrist: Secondary | ICD-10-CM | POA: Diagnosis not present

## 2024-01-13 DIAGNOSIS — Z4789 Encounter for other orthopedic aftercare: Secondary | ICD-10-CM | POA: Diagnosis not present

## 2024-01-18 DIAGNOSIS — Z23 Encounter for immunization: Secondary | ICD-10-CM | POA: Diagnosis not present

## 2024-01-27 DIAGNOSIS — M25632 Stiffness of left wrist, not elsewhere classified: Secondary | ICD-10-CM | POA: Diagnosis not present

## 2024-01-27 DIAGNOSIS — S56911D Strain of unspecified muscles, fascia and tendons at forearm level, right arm, subsequent encounter: Secondary | ICD-10-CM | POA: Diagnosis not present

## 2024-01-27 DIAGNOSIS — Z4789 Encounter for other orthopedic aftercare: Secondary | ICD-10-CM | POA: Diagnosis not present

## 2024-02-04 DIAGNOSIS — M25632 Stiffness of left wrist, not elsewhere classified: Secondary | ICD-10-CM | POA: Diagnosis not present

## 2024-02-06 DIAGNOSIS — R799 Abnormal finding of blood chemistry, unspecified: Secondary | ICD-10-CM | POA: Diagnosis not present

## 2024-02-06 DIAGNOSIS — M791 Myalgia, unspecified site: Secondary | ICD-10-CM | POA: Diagnosis not present

## 2024-02-06 DIAGNOSIS — E785 Hyperlipidemia, unspecified: Secondary | ICD-10-CM | POA: Diagnosis not present

## 2024-02-06 DIAGNOSIS — M818 Other osteoporosis without current pathological fracture: Secondary | ICD-10-CM | POA: Diagnosis not present

## 2024-02-11 DIAGNOSIS — M25632 Stiffness of left wrist, not elsewhere classified: Secondary | ICD-10-CM | POA: Diagnosis not present

## 2024-02-12 DIAGNOSIS — D225 Melanocytic nevi of trunk: Secondary | ICD-10-CM | POA: Diagnosis not present

## 2024-02-12 DIAGNOSIS — D2239 Melanocytic nevi of other parts of face: Secondary | ICD-10-CM | POA: Diagnosis not present

## 2024-02-12 DIAGNOSIS — L821 Other seborrheic keratosis: Secondary | ICD-10-CM | POA: Diagnosis not present

## 2024-02-12 DIAGNOSIS — L578 Other skin changes due to chronic exposure to nonionizing radiation: Secondary | ICD-10-CM | POA: Diagnosis not present

## 2024-02-12 DIAGNOSIS — D1730 Benign lipomatous neoplasm of skin and subcutaneous tissue of unspecified sites: Secondary | ICD-10-CM | POA: Diagnosis not present

## 2024-02-12 DIAGNOSIS — B351 Tinea unguium: Secondary | ICD-10-CM | POA: Diagnosis not present

## 2024-02-13 DIAGNOSIS — Z13220 Encounter for screening for lipoid disorders: Secondary | ICD-10-CM | POA: Diagnosis not present

## 2024-02-13 DIAGNOSIS — M810 Age-related osteoporosis without current pathological fracture: Secondary | ICD-10-CM | POA: Diagnosis not present

## 2024-02-13 DIAGNOSIS — E785 Hyperlipidemia, unspecified: Secondary | ICD-10-CM | POA: Diagnosis not present

## 2024-02-13 DIAGNOSIS — E782 Mixed hyperlipidemia: Secondary | ICD-10-CM | POA: Diagnosis not present

## 2024-02-13 DIAGNOSIS — Z8639 Personal history of other endocrine, nutritional and metabolic disease: Secondary | ICD-10-CM | POA: Diagnosis not present

## 2024-02-13 DIAGNOSIS — M818 Other osteoporosis without current pathological fracture: Secondary | ICD-10-CM | POA: Diagnosis not present

## 2024-02-13 DIAGNOSIS — M791 Myalgia, unspecified site: Secondary | ICD-10-CM | POA: Diagnosis not present

## 2024-02-13 DIAGNOSIS — R799 Abnormal finding of blood chemistry, unspecified: Secondary | ICD-10-CM | POA: Diagnosis not present

## 2024-02-17 DIAGNOSIS — M25632 Stiffness of left wrist, not elsewhere classified: Secondary | ICD-10-CM | POA: Diagnosis not present

## 2024-02-18 DIAGNOSIS — Z92241 Personal history of systemic steroid therapy: Secondary | ICD-10-CM | POA: Diagnosis not present

## 2024-02-18 DIAGNOSIS — M810 Age-related osteoporosis without current pathological fracture: Secondary | ICD-10-CM | POA: Diagnosis not present

## 2024-02-18 DIAGNOSIS — Z8781 Personal history of (healed) traumatic fracture: Secondary | ICD-10-CM | POA: Diagnosis not present

## 2024-02-21 DIAGNOSIS — Z8781 Personal history of (healed) traumatic fracture: Secondary | ICD-10-CM | POA: Diagnosis not present

## 2024-02-21 DIAGNOSIS — M810 Age-related osteoporosis without current pathological fracture: Secondary | ICD-10-CM | POA: Diagnosis not present

## 2024-02-24 DIAGNOSIS — M25632 Stiffness of left wrist, not elsewhere classified: Secondary | ICD-10-CM | POA: Diagnosis not present

## 2024-02-26 DIAGNOSIS — Z4789 Encounter for other orthopedic aftercare: Secondary | ICD-10-CM | POA: Diagnosis not present

## 2024-02-26 DIAGNOSIS — M25531 Pain in right wrist: Secondary | ICD-10-CM | POA: Diagnosis not present

## 2024-02-26 DIAGNOSIS — S52532D Colles' fracture of left radius, subsequent encounter for closed fracture with routine healing: Secondary | ICD-10-CM | POA: Diagnosis not present

## 2024-03-02 DIAGNOSIS — Z92241 Personal history of systemic steroid therapy: Secondary | ICD-10-CM | POA: Diagnosis not present

## 2024-03-02 DIAGNOSIS — Z8781 Personal history of (healed) traumatic fracture: Secondary | ICD-10-CM | POA: Diagnosis not present

## 2024-03-02 DIAGNOSIS — M810 Age-related osteoporosis without current pathological fracture: Secondary | ICD-10-CM | POA: Diagnosis not present

## 2024-03-05 DIAGNOSIS — M25632 Stiffness of left wrist, not elsewhere classified: Secondary | ICD-10-CM | POA: Diagnosis not present

## 2024-03-06 DIAGNOSIS — E785 Hyperlipidemia, unspecified: Secondary | ICD-10-CM | POA: Diagnosis not present

## 2024-03-11 DIAGNOSIS — H18513 Endothelial corneal dystrophy, bilateral: Secondary | ICD-10-CM | POA: Diagnosis not present

## 2024-03-11 DIAGNOSIS — H04211 Epiphora due to excess lacrimation, right lacrimal gland: Secondary | ICD-10-CM | POA: Diagnosis not present

## 2024-03-11 DIAGNOSIS — H43813 Vitreous degeneration, bilateral: Secondary | ICD-10-CM | POA: Diagnosis not present

## 2024-03-11 DIAGNOSIS — H35373 Puckering of macula, bilateral: Secondary | ICD-10-CM | POA: Diagnosis not present

## 2024-03-12 DIAGNOSIS — F411 Generalized anxiety disorder: Secondary | ICD-10-CM | POA: Diagnosis not present

## 2024-03-12 DIAGNOSIS — E785 Hyperlipidemia, unspecified: Secondary | ICD-10-CM | POA: Diagnosis not present

## 2024-03-12 DIAGNOSIS — M25632 Stiffness of left wrist, not elsewhere classified: Secondary | ICD-10-CM | POA: Diagnosis not present

## 2024-03-17 DIAGNOSIS — M25632 Stiffness of left wrist, not elsewhere classified: Secondary | ICD-10-CM | POA: Diagnosis not present

## 2024-04-06 DIAGNOSIS — M25632 Stiffness of left wrist, not elsewhere classified: Secondary | ICD-10-CM | POA: Diagnosis not present

## 2024-04-06 DIAGNOSIS — S52532A Colles' fracture of left radius, initial encounter for closed fracture: Secondary | ICD-10-CM | POA: Diagnosis not present

## 2024-04-06 DIAGNOSIS — M25532 Pain in left wrist: Secondary | ICD-10-CM | POA: Diagnosis not present

## 2024-04-06 DIAGNOSIS — Z4789 Encounter for other orthopedic aftercare: Secondary | ICD-10-CM | POA: Diagnosis not present

## 2024-04-06 DIAGNOSIS — M25531 Pain in right wrist: Secondary | ICD-10-CM | POA: Diagnosis not present

## 2024-04-06 DIAGNOSIS — S56911D Strain of unspecified muscles, fascia and tendons at forearm level, right arm, subsequent encounter: Secondary | ICD-10-CM | POA: Diagnosis not present

## 2024-04-10 ENCOUNTER — Ambulatory Visit: Attending: Internal Medicine

## 2024-04-10 ENCOUNTER — Other Ambulatory Visit: Payer: Self-pay

## 2024-04-10 DIAGNOSIS — M6281 Muscle weakness (generalized): Secondary | ICD-10-CM | POA: Insufficient documentation

## 2024-04-10 NOTE — Therapy (Signed)
 OUTPATIENT PHYSICAL THERAPY THORACOLUMBAR EVALUATION   Patient Name: Kayla Cannon MRN: [id number] DOB:1951-06-30, 73 y.o., female Today's Date: 04/14/2024  END OF SESSION:  PT End of Session - 04/14/24 1326     Visit Number 1    Number of Visits 9    Date for PT Re-Evaluation 06/09/24    Authorization Type Medicare    PT Start Time 1015    PT Stop Time 1100    PT Time Calculation (min) 45 min    Activity Tolerance Patient tolerated treatment well    Behavior During Therapy WFL for tasks assessed/performed          Past Medical History:  Diagnosis Date   Elevated cholesterol    Osteopenia 05/2016   T score -2.4 FRAX 8.7%/0.8% noting prior Fosamax use   PVC (premature ventricular contraction)    Past Surgical History:  Procedure Laterality Date   CERVICAL DISC SURGERY     CESAREAN SECTION     DILATION AND CURETTAGE OF UTERUS     ELBOW SURGERY     HYSTEROSCOPY     NASAL SINUS SURGERY     SHOULDER SURGERY     Patient Active Problem List   Diagnosis Date Noted   Elevated cholesterol    PVC (premature ventricular contraction)    Osteopenia     PCP: Landy Atlas, MD  REFERRING PROVIDER: Faythe Purchase, MD   REFERRING DIAG: postmenopausal osteoporis   Rationale for Evaluation and Treatment: Rehabilitation  THERAPY DIAG:  Muscle weakness (generalized)  ONSET DATE: Chronic  SUBJECTIVE:                                                                                                                                                                                           SUBJECTIVE STATEMENT: Pt presents to PT with reports of hx of increasing loss of bone mineral density. Had a fall a few months ago with resultant L wrist ORIF. Has been in PT in past and is very active with yoga and pilates. She is worried about her maintaining bone mineral density and would like to start an osteoporosis maintenance program.   PERTINENT HISTORY:  See PMH  PAIN:  Are you  having pain?  Yes: N/A  PRECAUTIONS: None  RED FLAGS: None   WEIGHT BEARING RESTRICTIONS: No  FALLS:  Has patient fallen in last 6 months? No  LIVING ENVIRONMENT: Lives with: lives with their family Lives in: House/apartment  OCCUPATION: Retired  PLOF: Independent  PATIENT GOALS: start a Emergency planning/management officer and osteoporosis program  NEXT MD VISIT: PRN  OBJECTIVE:  Note: Objective measures were completed at Evaluation unless  otherwise noted.  DIAGNOSTIC FINDINGS:  N/A  PATIENT SURVEYS:  PSFS: THE PATIENT SPECIFIC FUNCTIONAL SCALE  Place score of 0-10 (0 = unable to perform activity and 10 = able to perform activity at the same level as before injury or problem)  Activity Date: 04/10/24    Lifting everyday things 4    2. Opening bottle tops 3    3. Flexibility without pain 3    4.      Total Score 3.3      Total Score = Sum of activity scores/number of activities  Minimally Detectable Change: 3 points (for single activity); 2 points (for average score)  [facility] (nd). The Patient Specific Functional Scale . Retrieved from [email or web address]   COGNITION: Overall cognitive status: Within functional limits for tasks assessed     SENSATION: WFL  POSTURE: forward head and increased thoracic kyphosis  PALPATION: No overt TTP noted  LUMBAR ROM:   AROM eval  Flexion WNL  Extension WNL  Right lateral flexion   Left lateral flexion   Right rotation   Left rotation    (Blank rows = not tested)  UPPER EXTREMITY MMT:  MMT Right eval Left eval  Shoulder flexion    Shoulder extension    Shoulder abduction    Shoulder adduction    Shoulder extension    Shoulder internal rotation    Shoulder external rotation    Middle trapezius 3 3  Lower trapezius 3 3  Elbow flexion    Elbow extension    Wrist flexion    Wrist extension    Wrist ulnar deviation    Wrist radial deviation     Wrist pronation    Wrist supination    Grip strength     (Blank rows = not tested)  LOWER EXTREMITY MMT:    MMT Right eval Left eval  Hip flexion 4 4  Hip extension    Hip abduction 4 4  Hip adduction    Hip internal rotation    Hip external rotation    Knee flexion    Knee extension    Ankle dorsiflexion    Ankle plantarflexion    Ankle inversion    Ankle eversion     (Blank rows = not tested)  LUMBAR SPECIAL TESTS:  DNT  FUNCTIONAL TESTS:  30 Second Sit to Stand: 15 reps  GAIT: Distance walked: 62ft Assistive device utilized: None Level of assistance: Complete Independence Comments: no overt deviations  TREATMENT: [facility] Adult PT Treatment:                                                DATE: 04/10/24 Therapeutic Exercise: Prone Y/T x 10 SLR x 5 each Pilates bridge x 10 S/L hip abd x 5 Seated bilateral ER x 10 RTB Row x 10 black band Standing lumbar ext x 10  PATIENT EDUCATION:  Education details: eval findings, lumbar ext preference for osteoporosis, weighted vest for axial skeleton loading, PSFS, HEP, POC Person educated: Patient Education method: Explanation, Demonstration, and Handouts Education comprehension: verbalized understanding and returned demonstration  HOME EXERCISE PROGRAM: Access Code: [email or web address] URL: [email or web address] Date: 04/10/2024 Prepared by: Alm Kingdom  Exercises - Prone Scapular Retraction Y  - 1 x daily - 7 x weekly - 3 sets - 10 reps - Prone I  - 1 x daily - 7 x  weekly - 3 sets - 10 reps - Active Straight Leg Raise with Quad Set  - 1 x daily - 7 x weekly - 2-3 sets - 10 reps - 2lb hold - Pilates Bridge  - 1 x daily - 7 x weekly - 3 sets - 10 reps - Sidelying Hip Abduction  - 1 x daily - 7 x weekly - 3 sets - 10 reps - Shoulder External Rotation and Scapular Retraction with Resistance  - 1 x daily - 7 x weekly - 3 sets - 10 reps - green band hold - Standing Shoulder Row with Anchored Resistance  - 1 x daily  - 7 x weekly - 3 sets - 10 reps - black band hold - Standing Lumbar Extension  - 1 x daily - 7 x weekly - 2-3 sets - 10 reps - 5 sec hold  ASSESSMENT:  CLINICAL IMPRESSION: Patient is a 73 y.o. F who was seen today for physical therapy evaluation and treatment for osteoporosis management. Physical findings are consistent with MD impression and overall condition. Proximal muscle weakness noted, she would benefit from starting formal resistance training in order to maintain current bone mineral density levels and improve overall function.    OBJECTIVE IMPAIRMENTS: decreased activity tolerance, decreased mobility, decreased strength, and postural dysfunction  ACTIVITY LIMITATIONS: standing, squatting, stairs, and locomotion level  PARTICIPATION LIMITATIONS: driving, shopping, community activity, and occupation  PERSONAL FACTORS: Time since onset of injury/illness/exacerbation are also affecting patient's functional outcome.   REHAB POTENTIAL: Excellent  CLINICAL DECISION MAKING: Stable/uncomplicated  EVALUATION COMPLEXITY: Low   GOALS: Goals reviewed with patient? No  SHORT TERM GOALS: Target date: 05/05/2024    Pt will be compliant and knowledgeable with initial HEP for improved comfort and carryover Baseline: initial HEP given  Goal status: INITIAL  LONG TERM GOALS: Target date: 06/09/2024    Pt will improve PSFS by 3 points for improved subjective functional ability with ADLs and desired activities  Baseline: 3.3 Goal status: INITIAL  2.  Pt will improve all UE and LE MMT to no less than 5/5 for improved postural support and maintenance  Baseline: see charts Goal status: INITIAL  3.  Pt will be compliant and knowledgeable with final HEP for improved comfort and carryover post discharge Baseline: N/A Goal status: INITIAL  4.  Pt will be able to lift 25lb KB from floor to chest height maintaining back extension with no increase in pain for improved comfort and function  with ADLs Baseline: unable Goal status: INITIAL  PLAN:  PT FREQUENCY: 1x/week  PT DURATION: 8 weeks  PLANNED INTERVENTIONS: 97164- PT Re-evaluation, 97110-Therapeutic exercises, 97530- Therapeutic activity, [id number]- Neuromuscular re-education, 97535- Self Care, [id number]- Manual therapy, [id number]- Gait training, [id number]- Electrical stimulation (unattended), [id number]- Electrical stimulation (manual), 97016- Vasopneumatic device, 20560 (1-2 muscles), 20561 (3+ muscles)- Dry Needling, Cryotherapy, and Moist heat  PLAN FOR NEXT SESSION: assess HEP response, progressively loading resistance program   Alm JAYSON Kingdom, PT 04/14/2024, 1:32 PM

## 2024-04-12 DIAGNOSIS — F411 Generalized anxiety disorder: Secondary | ICD-10-CM | POA: Diagnosis not present

## 2024-04-12 DIAGNOSIS — E785 Hyperlipidemia, unspecified: Secondary | ICD-10-CM | POA: Diagnosis not present

## 2024-04-23 DIAGNOSIS — M25632 Stiffness of left wrist, not elsewhere classified: Secondary | ICD-10-CM | POA: Diagnosis not present

## 2024-04-25 DIAGNOSIS — Z23 Encounter for immunization: Secondary | ICD-10-CM | POA: Diagnosis not present

## 2024-04-30 ENCOUNTER — Ambulatory Visit

## 2024-05-07 ENCOUNTER — Ambulatory Visit: Attending: Internal Medicine

## 2024-05-07 DIAGNOSIS — M6281 Muscle weakness (generalized): Secondary | ICD-10-CM | POA: Diagnosis not present

## 2024-05-07 NOTE — Therapy (Signed)
 OUTPATIENT PHYSICAL THERAPY TREATMENT   Patient Name: ALLIZON WOZNICK MRN: [id number] DOB:1950/12/26, 73 y.o., female Today's Date: 05/07/2024  END OF SESSION:  PT End of Session - 05/07/24 1151     Visit Number 2    Number of Visits 9    Date for Recertification  06/09/24    Authorization Type Medicare    PT Start Time 1150    PT Stop Time 1228    PT Time Calculation (min) 38 min    Activity Tolerance Patient tolerated treatment well    Behavior During Therapy [employer] [facility] for tasks assessed/performed           Past Medical History:  Diagnosis Date   Elevated cholesterol    Osteopenia 05/2016   T score -2.4 FRAX 8.7%/0.8% noting prior Fosamax use   PVC (premature ventricular contraction)    Past Surgical History:  Procedure Laterality Date   CERVICAL DISC SURGERY     CESAREAN SECTION     DILATION AND CURETTAGE OF UTERUS     ELBOW SURGERY     HYSTEROSCOPY     NASAL SINUS SURGERY     SHOULDER SURGERY     Patient Active Problem List   Diagnosis Date Noted   Elevated cholesterol    PVC (premature ventricular contraction)    Osteopenia     PCP: Landy Atlas, MD  REFERRING PROVIDER: Faythe Purchase, MD   REFERRING DIAG: postmenopausal osteoporois   Rationale for Evaluation and Treatment: Rehabilitation  THERAPY DIAG:  Muscle weakness (generalized)  ONSET DATE: Chronic  SUBJECTIVE:                                                                                                                                                                                           SUBJECTIVE STATEMENT: Pt present to PT with no current pain, with exception of L wrist. Has been fairly compliant with HEP.   EVAL: Pt presents to PT with reports of hx of increasing loss of bone mineral density. Had a fall a few months ago with resultant L wrist ORIF. Has been in PT in past and is very active with yoga and pilates. She is worried about her maintaining bone mineral density and would like to  start an osteoporosis maintenance program.   PERTINENT HISTORY:  See PMH  PAIN:  Are you having pain?  Yes: N/A  PRECAUTIONS: None  RED FLAGS: None   WEIGHT BEARING RESTRICTIONS: No  FALLS:  Has patient fallen in last 6 months? No  LIVING ENVIRONMENT: Lives with: lives with their family Lives in: House/apartment  OCCUPATION: Retired  PLOF: Independent  PATIENT GOALS: start  a resistance training and osteoporosis program  NEXT MD VISIT: PRN  OBJECTIVE:  Note: Objective measures were completed at Evaluation unless otherwise noted.  DIAGNOSTIC FINDINGS:  N/A  PATIENT SURVEYS:  PSFS: THE PATIENT SPECIFIC FUNCTIONAL SCALE  Place score of 0-10 (0 = unable to perform activity and 10 = able to perform activity at the same level as before injury or problem)  Activity Date: 04/10/24    Lifting everyday things 4    2. Opening bottle tops 3    3. Flexibility without pain 3    4.      Total Score 3.3      Total Score = Sum of activity scores/number of activities  Minimally Detectable Change: 3 points (for single activity); 2 points (for average score)  [facility] (nd). The Patient Specific Functional Scale . Retrieved from [email or web address]   COGNITION: Overall cognitive status: Within functional limits for tasks assessed     SENSATION: WFL  POSTURE: forward head and increased thoracic kyphosis  PALPATION: No overt TTP noted  LUMBAR ROM:   AROM eval  Flexion WNL  Extension WNL  Right lateral flexion   Left lateral flexion   Right rotation   Left rotation    (Blank rows = not tested)  UPPER EXTREMITY MMT:  MMT Right eval Left eval  Shoulder flexion    Shoulder extension    Shoulder abduction    Shoulder adduction    Shoulder extension    Shoulder internal rotation    Shoulder external rotation    Middle trapezius 3 3  Lower trapezius 3 3  Elbow flexion    Elbow extension     Wrist flexion    Wrist extension    Wrist ulnar deviation    Wrist radial deviation    Wrist pronation    Wrist supination    Grip strength     (Blank rows = not tested)  LOWER EXTREMITY MMT:    MMT Right eval Left eval  Hip flexion 4 4  Hip extension    Hip abduction 4 4  Hip adduction    Hip internal rotation    Hip external rotation    Knee flexion    Knee extension    Ankle dorsiflexion    Ankle plantarflexion    Ankle inversion    Ankle eversion     (Blank rows = not tested)  LUMBAR SPECIAL TESTS:  DNT  FUNCTIONAL TESTS:  30 Second Sit to Stand: 15 reps  GAIT: Distance walked: 31ft Assistive device utilized: None Level of assistance: Complete Independence Comments: no overt deviations  TREATMENT: [facility] Adult PT Treatment:                                                DATE: 05/07/24 Pilates bridge x 15 Single leg bridge 2x10 ea Supine horizontal abd 2x15 GTB Seated bilateral ER 2x10 GTB S/L hip abd 2x10 2# Supine SLR 2x10 2# Prone Y/T 2x10 1# Seated row 2x10 25#  [facility] Adult PT Treatment:                                                DATE: 04/10/24 Therapeutic Exercise: Prone Y/T x 10 SLR x 5  each Pilates bridge x 10 S/L hip abd x 5 Seated bilateral ER x 10 RTB Row x 10 black band Standing lumbar ext x 10  PATIENT EDUCATION:  Education details: eval findings, lumbar ext preference for osteoporosis, weighted vest for axial skeleton loading, PSFS, HEP, POC Person educated: Patient Education method: Explanation, Demonstration, and Handouts Education comprehension: verbalized understanding and returned demonstration  HOME EXERCISE PROGRAM: Access Code: [email or web address] URL: [email or web address] Date: 05/07/2024 Prepared by: Alm Kingdom  Exercises - Prone Scapular Retraction Y  - 1 x daily - 7 x weekly - 3 sets - 10 reps - Prone I  - 1 x daily - 7 x weekly - 3 sets - 10 reps - Active Straight Leg Raise with Quad Set  - 1 x daily - 7 x  weekly - 2-3 sets - 10 reps - 2lb hold - Pilates Bridge  - 1 x daily - 7 x weekly - 3 sets - 10 reps - Sidelying Hip Abduction  - 1 x daily - 7 x weekly - 3 sets - 10 reps - Shoulder External Rotation and Scapular Retraction with Resistance  - 1 x daily - 7 x weekly - 3 sets - 10 reps - green band hold - Standing Shoulder Row with Anchored Resistance  - 1 x daily - 7 x weekly - 3 sets - 10 reps - black band hold - Standing Lumbar Extension  - 1 x daily - 7 x weekly - 2-3 sets - 10 reps - 5 sec hold - Supine Shoulder Horizontal Abduction with Resistance  - 1 x daily - 7 x weekly - 3 sets - 15 reps - green band hold  ASSESSMENT:  CLINICAL IMPRESSION: Pt was able to complete prescribed exercises with no adverse effect. Exercises today focused on improving postural muscle strength in presence of osteoporosis. Pt HEP updated for continued progression. Will continue as able per POC.   EVAL: Patient is a 73 y.o. F who was seen today for physical therapy evaluation and treatment for osteoporosis management. Physical findings are consistent with MD impression and overall condition. Proximal muscle weakness noted, she would benefit from starting formal resistance training in order to maintain current bone mineral density levels and improve overall function.    OBJECTIVE IMPAIRMENTS: decreased activity tolerance, decreased mobility, decreased strength, and postural dysfunction  ACTIVITY LIMITATIONS: standing, squatting, stairs, and locomotion level  PARTICIPATION LIMITATIONS: driving, shopping, community activity, and occupation  PERSONAL FACTORS: Time since onset of injury/illness/exacerbation are also affecting patient's functional outcome.   REHAB POTENTIAL: Excellent  CLINICAL DECISION MAKING: Stable/uncomplicated  EVALUATION COMPLEXITY: Low   GOALS: Goals reviewed with patient? No  SHORT TERM GOALS: Target date: 05/05/2024    Pt will be compliant and knowledgeable with initial HEP for  improved comfort and carryover Baseline: initial HEP given  Goal status: INITIAL  LONG TERM GOALS: Target date: 06/09/2024    Pt will improve PSFS by 3 points for improved subjective functional ability with ADLs and desired activities  Baseline: 3.3 Goal status: INITIAL  2.  Pt will improve all UE and LE MMT to no less than 5/5 for improved postural support and maintenance  Baseline: see charts Goal status: INITIAL  3.  Pt will be compliant and knowledgeable with final HEP for improved comfort and carryover post discharge Baseline: N/A Goal status: INITIAL  4.  Pt will be able to lift 25lb KB from floor to chest height maintaining back extension with no increase in pain for improved comfort and function  with ADLs Baseline: unable Goal status: INITIAL  PLAN:  PT FREQUENCY: 1x/week  PT DURATION: 8 weeks  PLANNED INTERVENTIONS: 97164- PT Re-evaluation, 97110-Therapeutic exercises, 97530- Therapeutic activity, [id number]- Neuromuscular re-education, 97535- Self Care, 02859- Manual therapy, [id number]- Gait training, [phone]- Electrical stimulation (unattended), [id number]- Electrical stimulation (manual), 97016- Vasopneumatic device, 20560 (1-2 muscles), 20561 (3+ muscles)- Dry Needling, Cryotherapy, and Moist heat  PLAN FOR NEXT SESSION: assess HEP response, progressively loading resistance program   Alm JAYSON Kingdom, PT 05/07/2024, 2:58 PM

## 2024-05-12 DIAGNOSIS — F411 Generalized anxiety disorder: Secondary | ICD-10-CM | POA: Diagnosis not present

## 2024-05-12 DIAGNOSIS — E785 Hyperlipidemia, unspecified: Secondary | ICD-10-CM | POA: Diagnosis not present

## 2024-05-14 DIAGNOSIS — M25632 Stiffness of left wrist, not elsewhere classified: Secondary | ICD-10-CM | POA: Diagnosis not present

## 2024-05-15 ENCOUNTER — Encounter: Admitting: Physical Therapy

## 2024-05-15 DIAGNOSIS — L409 Psoriasis, unspecified: Secondary | ICD-10-CM | POA: Diagnosis not present

## 2024-05-16 DIAGNOSIS — Z23 Encounter for immunization: Secondary | ICD-10-CM | POA: Diagnosis not present

## 2024-05-26 ENCOUNTER — Ambulatory Visit: Attending: Internal Medicine

## 2024-05-26 ENCOUNTER — Ambulatory Visit

## 2024-05-26 DIAGNOSIS — M6281 Muscle weakness (generalized): Secondary | ICD-10-CM | POA: Diagnosis not present

## 2024-05-26 NOTE — Therapy (Incomplete)
 OUTPATIENT PHYSICAL THERAPY TREATMENT   Patient Name: Kayla Cannon MRN: [id number] DOB:08-25-1950, 73 y.o., female Today's Date: 05/26/2024  END OF SESSION:     Past Medical History:  Diagnosis Date   Elevated cholesterol    Osteopenia 05/2016   T score -2.4 FRAX 8.7%/0.8% noting prior Fosamax use   PVC (premature ventricular contraction)    Past Surgical History:  Procedure Laterality Date   CERVICAL DISC SURGERY     CESAREAN SECTION     DILATION AND CURETTAGE OF UTERUS     ELBOW SURGERY     HYSTEROSCOPY     NASAL SINUS SURGERY     SHOULDER SURGERY     Patient Active Problem List   Diagnosis Date Noted   Elevated cholesterol    PVC (premature ventricular contraction)    Osteopenia     PCP: Landy Atlas, MD  REFERRING PROVIDER: Faythe Purchase, MD   REFERRING DIAG: postmenopausal osteoporois   Rationale for Evaluation and Treatment: Rehabilitation  THERAPY DIAG:  No diagnosis found.  ONSET DATE: Chronic  SUBJECTIVE:                                                                                                                                                                                           SUBJECTIVE STATEMENT: ***  EVAL: Pt presents to PT with reports of hx of increasing loss of bone mineral density. Had a fall a few months ago with resultant L wrist ORIF. Has been in PT in past and is very active with yoga and pilates. She is worried about her maintaining bone mineral density and would like to start an osteoporosis maintenance program.   PERTINENT HISTORY:  See PMH  PAIN:  Are you having pain?  Yes: N/A  PRECAUTIONS: None  RED FLAGS: None   WEIGHT BEARING RESTRICTIONS: No  FALLS:  Has patient fallen in last 6 months? No  LIVING ENVIRONMENT: Lives with: lives with their family Lives in: House/apartment  OCCUPATION: Retired  PLOF: Independent  PATIENT GOALS: start a Emergency planning/management officer and osteoporosis program  NEXT MD  VISIT: PRN  OBJECTIVE:  Note: Objective measures were completed at Evaluation unless otherwise noted.  DIAGNOSTIC FINDINGS:  N/A  PATIENT SURVEYS:  PSFS: THE PATIENT SPECIFIC FUNCTIONAL SCALE  Place score of 0-10 (0 = unable to perform activity and 10 = able to perform activity at the same level as before injury or problem)  Activity Date: 04/10/24    Lifting everyday things 4    2. Opening bottle tops 3    3. Flexibility without pain 3    4.      Total  Score 3.3      Total Score = Sum of activity scores/number of activities  Minimally Detectable Change: 3 points (for single activity); 2 points (for average score)  [facility] (nd). The Patient Specific Functional Scale . Retrieved from [email or web address]   COGNITION: Overall cognitive status: Within functional limits for tasks assessed     SENSATION: WFL  POSTURE: forward head and increased thoracic kyphosis  PALPATION: No overt TTP noted  LUMBAR ROM:   AROM eval  Flexion WNL  Extension WNL  Right lateral flexion   Left lateral flexion   Right rotation   Left rotation    (Blank rows = not tested)  UPPER EXTREMITY MMT:  MMT Right eval Left eval  Shoulder flexion    Shoulder extension    Shoulder abduction    Shoulder adduction    Shoulder extension    Shoulder internal rotation    Shoulder external rotation    Middle trapezius 3 3  Lower trapezius 3 3  Elbow flexion    Elbow extension    Wrist flexion    Wrist extension    Wrist ulnar deviation    Wrist radial deviation    Wrist pronation    Wrist supination    Grip strength     (Blank rows = not tested)  LOWER EXTREMITY MMT:    MMT Right eval Left eval  Hip flexion 4 4  Hip extension    Hip abduction 4 4  Hip adduction    Hip internal rotation    Hip external rotation    Knee flexion    Knee extension    Ankle dorsiflexion    Ankle plantarflexion    Ankle  inversion    Ankle eversion     (Blank rows = not tested)  LUMBAR SPECIAL TESTS:  DNT  FUNCTIONAL TESTS:  30 Second Sit to Stand: 15 reps  GAIT: Distance walked: 1ft Assistive device utilized: None Level of assistance: Complete Independence Comments: no overt deviations  TREATMENT: [facility] Adult PT Treatment:                                                DATE: 05/26/24 Pilates bridge x 15 Single leg bridge 2x10 ea Supine horizontal abd 2x15 GTB Seated bilateral ER 2x10 GTB S/L hip abd 2x10 2# Supine SLR 2x10 2# Prone Y/T 2x10 1# Seated row 2x10 25#  [facility] Adult PT Treatment:                                                DATE: 05/07/24 Pilates bridge x 15 Single leg bridge 2x10 ea Supine horizontal abd 2x15 GTB Seated bilateral ER 2x10 GTB S/L hip abd 2x10 2# Supine SLR 2x10 2# Prone Y/T 2x10 1# Seated row 2x10 25#  [facility] Adult PT Treatment:                                                DATE: 04/10/24 Therapeutic Exercise: Prone Y/T x 10 SLR x 5 each Pilates bridge x 10 S/L hip abd x 5 Seated bilateral ER  x 10 RTB Row x 10 black band Standing lumbar ext x 10  PATIENT EDUCATION:  Education details: eval findings, lumbar ext preference for osteoporosis, weighted vest for axial skeleton loading, PSFS, HEP, POC Person educated: Patient Education method: Explanation, Demonstration, and Handouts Education comprehension: verbalized understanding and returned demonstration  HOME EXERCISE PROGRAM: Access Code: [email or web address] URL: [email or web address] Date: 05/07/2024 Prepared by: Alm Kingdom  Exercises - Prone Scapular Retraction Y  - 1 x daily - 7 x weekly - 3 sets - 10 reps - Prone I  - 1 x daily - 7 x weekly - 3 sets - 10 reps - Active Straight Leg Raise with Quad Set  - 1 x daily - 7 x weekly - 2-3 sets - 10 reps - 2lb hold - Pilates Bridge  - 1 x daily - 7 x weekly - 3 sets - 10 reps - Sidelying Hip Abduction  - 1 x daily - 7 x weekly - 3 sets - 10  reps - Shoulder External Rotation and Scapular Retraction with Resistance  - 1 x daily - 7 x weekly - 3 sets - 10 reps - green band hold - Standing Shoulder Row with Anchored Resistance  - 1 x daily - 7 x weekly - 3 sets - 10 reps - black band hold - Standing Lumbar Extension  - 1 x daily - 7 x weekly - 2-3 sets - 10 reps - 5 sec hold - Supine Shoulder Horizontal Abduction with Resistance  - 1 x daily - 7 x weekly - 3 sets - 15 reps - green band hold  ASSESSMENT:  CLINICAL IMPRESSION: *** Pt was able to complete prescribed exercises with no adverse effect. Exercises today focused on improving postural muscle strength in presence of osteoporosis. Pt HEP updated for continued progression. Will continue as able per POC.   EVAL: Patient is a 73 y.o. F who was seen today for physical therapy evaluation and treatment for osteoporosis management. Physical findings are consistent with MD impression and overall condition. Proximal muscle weakness noted, she would benefit from starting formal resistance training in order to maintain current bone mineral density levels and improve overall function.    OBJECTIVE IMPAIRMENTS: decreased activity tolerance, decreased mobility, decreased strength, and postural dysfunction  ACTIVITY LIMITATIONS: standing, squatting, stairs, and locomotion level  PARTICIPATION LIMITATIONS: driving, shopping, community activity, and occupation  PERSONAL FACTORS: Time since onset of injury/illness/exacerbation are also affecting patient's functional outcome.   REHAB POTENTIAL: Excellent  CLINICAL DECISION MAKING: Stable/uncomplicated  EVALUATION COMPLEXITY: Low   GOALS: Goals reviewed with patient? No  SHORT TERM GOALS: Target date: 05/05/2024    Pt will be compliant and knowledgeable with initial HEP for improved comfort and carryover Baseline: initial HEP given  Goal status: INITIAL  LONG TERM GOALS: Target date: 06/09/2024    Pt will improve PSFS by 3 points  for improved subjective functional ability with ADLs and desired activities  Baseline: 3.3 Goal status: INITIAL  2.  Pt will improve all UE and LE MMT to no less than 5/5 for improved postural support and maintenance  Baseline: see charts Goal status: INITIAL  3.  Pt will be compliant and knowledgeable with final HEP for improved comfort and carryover post discharge Baseline: N/A Goal status: INITIAL  4.  Pt will be able to lift 25lb KB from floor to chest height maintaining back extension with no increase in pain for improved comfort and function with ADLs Baseline: unable Goal status: INITIAL  PLAN:  PT FREQUENCY:  1x/week  PT DURATION: 8 weeks  PLANNED INTERVENTIONS: 97164- PT Re-evaluation, 97110-Therapeutic exercises, 97530- Therapeutic activity, [id number]- Neuromuscular re-education, 97535- Self Care, [id number]- Manual therapy, [id number]- Gait training, [phone]- Electrical stimulation (unattended), [id number]- Electrical stimulation (manual), 97016- Vasopneumatic device, 20560 (1-2 muscles), 20561 (3+ muscles)- Dry Needling, Cryotherapy, and Moist heat  PLAN FOR NEXT SESSION: assess HEP response, progressively loading resistance program   Alm JAYSON Kingdom, PT 05/26/2024, 7:50 AM

## 2024-05-26 NOTE — Therapy (Signed)
 OUTPATIENT PHYSICAL THERAPY TREATMENT/DISCHARGE  PHYSICAL THERAPY DISCHARGE SUMMARY  Visits from Start of Care: 3  Current functional level related to goals / functional outcomes: See goals and objective    Remaining deficits: See goals and objective   Education / Equipment: HEP   Patient agrees to discharge. Patient goals were met. Patient is being discharged due to meeting the stated rehab goals.   Patient Name: Kayla Cannon MRN: [id number] DOB:1950/11/25, 73 y.o., female Today's Date: 05/27/2024  END OF SESSION:  PT End of Session - 05/26/24 1356     Visit Number 3    Number of Visits 9    Date for Recertification  06/09/24    Authorization Type Medicare    PT Start Time 1400    PT Stop Time 1425    PT Time Calculation (min) 25 min    Activity Tolerance Patient tolerated treatment well    Behavior During Therapy [facility] for tasks assessed/performed            Past Medical History:  Diagnosis Date   Elevated cholesterol    Osteopenia 05/2016   T score -2.4 FRAX 8.7%/0.8% noting prior Fosamax use   PVC (premature ventricular contraction)    Past Surgical History:  Procedure Laterality Date   CERVICAL DISC SURGERY     CESAREAN SECTION     DILATION AND CURETTAGE OF UTERUS     ELBOW SURGERY     HYSTEROSCOPY     NASAL SINUS SURGERY     SHOULDER SURGERY     Patient Active Problem List   Diagnosis Date Noted   Elevated cholesterol    PVC (premature ventricular contraction)    Osteopenia     PCP: Landy Atlas, MD  REFERRING PROVIDER: Faythe Purchase, MD   REFERRING DIAG: postmenopausal osteoporois   Rationale for Evaluation and Treatment: Rehabilitation  THERAPY DIAG:  Muscle weakness (generalized)  ONSET DATE: Chronic  SUBJECTIVE:                                                                                                                                                                                           SUBJECTIVE STATEMENT: Pt  presents to PT after long trip to [place]. Pt has been compliant with HEP and feels good about current osteoporosis exercise management. Has begun walking with 5# weighted vest for axial skeleton loading.   EVAL: Pt presents to PT with reports of hx of increasing loss of bone mineral density. Had a fall a few months ago with resultant L wrist ORIF. Has been in PT in past and is very active with yoga and pilates. She is worried  about her maintaining bone mineral density and would like to start an osteoporosis maintenance program.   PERTINENT HISTORY:  See PMH  PAIN:  Are you having pain?  Yes: N/A  PRECAUTIONS: None  RED FLAGS: None   WEIGHT BEARING RESTRICTIONS: No  FALLS:  Has patient fallen in last 6 months? No  LIVING ENVIRONMENT: Lives with: lives with their family Lives in: House/apartment  OCCUPATION: Retired  PLOF: Independent  PATIENT GOALS: start a Emergency planning/management officer and osteoporosis program  NEXT MD VISIT: PRN  OBJECTIVE:  Note: Objective measures were completed at Evaluation unless otherwise noted.  DIAGNOSTIC FINDINGS:  N/A  PATIENT SURVEYS:  PSFS: THE PATIENT SPECIFIC FUNCTIONAL SCALE  Place score of 0-10 (0 = unable to perform activity and 10 = able to perform activity at the same level as before injury or problem)  Activity Date: 04/10/24 05/26/24   Lifting everyday things 4 9   2. Opening bottle tops 3 9   3. Flexibility without pain 3 9   4.      Total Score 3.3 9     Total Score = Sum of activity scores/number of activities  Minimally Detectable Change: 3 points (for single activity); 2 points (for average score)  [employer] [facility] (nd). The Patient Specific Functional Scale . Retrieved from [email or web address]   COGNITION: Overall cognitive status: Within functional limits for tasks assessed     SENSATION: WFL  POSTURE: forward head and increased thoracic  kyphosis  PALPATION: No overt TTP noted  LUMBAR ROM:   AROM eval  Flexion WNL  Extension WNL  Right lateral flexion   Left lateral flexion   Right rotation   Left rotation    (Blank rows = not tested)  UPPER EXTREMITY MMT:  MMT Right eval Left eval Right 10/14 Left 10/14  Shoulder flexion      Shoulder extension      Shoulder abduction      Shoulder adduction      Shoulder extension      Shoulder internal rotation      Shoulder external rotation      Middle trapezius 3 3 4 4   Lower trapezius 3 3 4 4   Elbow flexion      Elbow extension      Wrist flexion      Wrist extension      Wrist ulnar deviation      Wrist radial deviation      Wrist pronation      Wrist supination      Grip strength       (Blank rows = not tested)  LOWER EXTREMITY MMT:    MMT Right eval Left eval Right 10/14 Left 10/14  Hip flexion 4 4 5 5   Hip extension      Hip abduction 4 4 5 5   Hip adduction      Hip internal rotation      Hip external rotation      Knee flexion      Knee extension      Ankle dorsiflexion      Ankle plantarflexion      Ankle inversion      Ankle eversion       (Blank rows = not tested)  LUMBAR SPECIAL TESTS:  DNT  FUNCTIONAL TESTS:  30 Second Sit to Stand: 15 reps  GAIT: Distance walked: 24ft Assistive device utilized: None Level of assistance: Complete Independence Comments: no overt deviations  TREATMENT: [facility] Adult PT Treatment:  DATE: 05/26/24 Pilates bridge x 15 S/L hip abd 2x10 2# Prone Y/T 2x10 1# Review of HEP Review of tests/measures, goals, and outcomes  [facility] Adult PT Treatment:                                                DATE: 05/07/24 Pilates bridge x 15 Single leg bridge 2x10 ea Supine horizontal abd 2x15 GTB Seated bilateral ER 2x10 GTB S/L hip abd 2x10 2# Supine SLR 2x10 2# Prone Y/T 2x10 1# Seated row 2x10 25#  [facility] Adult PT Treatment:                                                 DATE: 04/10/24 Therapeutic Exercise: Prone Y/T x 10 SLR x 5 each Pilates bridge x 10 S/L hip abd x 5 Seated bilateral ER x 10 RTB Row x 10 black band Standing lumbar ext x 10  PATIENT EDUCATION:  Education details: eval findings, lumbar ext preference for osteoporosis, weighted vest for axial skeleton loading, PSFS, HEP, POC Person educated: Patient Education method: Explanation, Demonstration, and Handouts Education comprehension: verbalized understanding and returned demonstration  HOME EXERCISE PROGRAM: Access Code: [email or web address] URL: [email or web address] Date: 05/07/2024 Prepared by: Alm Kingdom  Exercises - Prone Scapular Retraction Y  - 1 x daily - 7 x weekly - 3 sets - 10 reps - Prone I  - 1 x daily - 7 x weekly - 3 sets - 10 reps - Active Straight Leg Raise with Quad Set  - 1 x daily - 7 x weekly - 2-3 sets - 10 reps - 2lb hold - Pilates Bridge  - 1 x daily - 7 x weekly - 3 sets - 10 reps - Sidelying Hip Abduction  - 1 x daily - 7 x weekly - 3 sets - 10 reps - Shoulder External Rotation and Scapular Retraction with Resistance  - 1 x daily - 7 x weekly - 3 sets - 10 reps - green band hold - Standing Shoulder Row with Anchored Resistance  - 1 x daily - 7 x weekly - 3 sets - 10 reps - black band hold - Standing Lumbar Extension  - 1 x daily - 7 x weekly - 2-3 sets - 10 reps - 5 sec hold - Supine Shoulder Horizontal Abduction with Resistance  - 1 x daily - 7 x weekly - 3 sets - 15 reps - green band hold  ASSESSMENT:  CLINICAL IMPRESSION: Pt was able to complete prescribed exercises with no adverse effect. Today we reviewed goals and discussed HEP and management plan for osteoporosis. She feels good about current set up and will continue with HEP and weighted vest recreational walking. Pt feels appropriate for d/c at this time.   EVAL: Patient is a 73 y.o. F who was seen today for physical therapy evaluation and treatment for osteoporosis management.  Physical findings are consistent with MD impression and overall condition. Proximal muscle weakness noted, she would benefit from starting formal resistance training in order to maintain current bone mineral density levels and improve overall function.    OBJECTIVE IMPAIRMENTS: decreased activity tolerance, decreased mobility, decreased strength, and postural dysfunction  ACTIVITY LIMITATIONS: standing, squatting, stairs, and locomotion level  PARTICIPATION  LIMITATIONS: driving, shopping, community activity, and occupation  PERSONAL FACTORS: Time since onset of injury/illness/exacerbation are also affecting patient's functional outcome.   REHAB POTENTIAL: Excellent  CLINICAL DECISION MAKING: Stable/uncomplicated  EVALUATION COMPLEXITY: Low   GOALS: Goals reviewed with patient? No  SHORT TERM GOALS: Target date: 05/05/2024    Pt will be compliant and knowledgeable with initial HEP for improved comfort and carryover Baseline: initial HEP given  Goal status: MET  LONG TERM GOALS: Target date: 06/09/2024    Pt will improve PSFS by 3 points for improved subjective functional ability with ADLs and desired activities  Baseline: 3.3 05/26/2024: 9 Goal status: MET  2.  Pt will improve all UE and LE MMT to no less than 5/5 for improved postural support and maintenance  Baseline: see charts Goal status: MET  3.  Pt will be compliant and knowledgeable with final HEP for improved comfort and carryover post discharge Baseline: N/A Goal status: MET  4.  Pt will be able to lift 25lb KB from floor to chest height maintaining back extension with no increase in pain for improved comfort and function with ADLs Baseline: unable Goal status: MET  PLAN:  PT FREQUENCY: 1x/week  PT DURATION: 8 weeks  PLANNED INTERVENTIONS: 97164- PT Re-evaluation, 97110-Therapeutic exercises, 97530- Therapeutic activity, [id number]- Neuromuscular re-education, 97535- Self Care, 02859- Manual therapy, [id number]-  Gait training, [id number]- Electrical stimulation (unattended), [id number]- Electrical stimulation (manual), 97016- Vasopneumatic device, 20560 (1-2 muscles), 20561 (3+ muscles)- Dry Needling, Cryotherapy, and Moist heat  PLAN FOR NEXT SESSION: assess HEP response, progressively loading resistance program   Alm JAYSON Kingdom, PT 05/27/2024, 1:05 PM

## 2024-05-28 DIAGNOSIS — M25632 Stiffness of left wrist, not elsewhere classified: Secondary | ICD-10-CM | POA: Diagnosis not present

## 2024-06-01 DIAGNOSIS — S52532D Colles' fracture of left radius, subsequent encounter for closed fracture with routine healing: Secondary | ICD-10-CM | POA: Diagnosis not present

## 2024-06-01 DIAGNOSIS — S56911D Strain of unspecified muscles, fascia and tendons at forearm level, right arm, subsequent encounter: Secondary | ICD-10-CM | POA: Diagnosis not present

## 2024-06-01 DIAGNOSIS — Z4789 Encounter for other orthopedic aftercare: Secondary | ICD-10-CM | POA: Diagnosis not present

## 2024-06-04 DIAGNOSIS — M4316 Spondylolisthesis, lumbar region: Secondary | ICD-10-CM | POA: Diagnosis not present

## 2024-06-04 DIAGNOSIS — R29898 Other symptoms and signs involving the musculoskeletal system: Secondary | ICD-10-CM | POA: Diagnosis not present

## 2024-06-04 DIAGNOSIS — R251 Tremor, unspecified: Secondary | ICD-10-CM | POA: Diagnosis not present

## 2024-06-04 DIAGNOSIS — M810 Age-related osteoporosis without current pathological fracture: Secondary | ICD-10-CM | POA: Diagnosis not present

## 2024-06-05 ENCOUNTER — Other Ambulatory Visit: Payer: Self-pay | Admitting: Internal Medicine

## 2024-06-05 DIAGNOSIS — R29898 Other symptoms and signs involving the musculoskeletal system: Secondary | ICD-10-CM

## 2024-06-05 DIAGNOSIS — Z1231 Encounter for screening mammogram for malignant neoplasm of breast: Secondary | ICD-10-CM | POA: Diagnosis not present

## 2024-06-07 ENCOUNTER — Ambulatory Visit
Admission: RE | Admit: 2024-06-07 | Discharge: 2024-06-07 | Disposition: A | Discharge status: Home / Self Care | Source: Ambulatory Visit | Attending: Internal Medicine | Admitting: Internal Medicine

## 2024-06-07 DIAGNOSIS — R29898 Other symptoms and signs involving the musculoskeletal system: Secondary | ICD-10-CM

## 2024-06-07 DIAGNOSIS — M5126 Other intervertebral disc displacement, lumbar region: Secondary | ICD-10-CM | POA: Diagnosis not present

## 2024-06-07 DIAGNOSIS — M47817 Spondylosis without myelopathy or radiculopathy, lumbosacral region: Secondary | ICD-10-CM | POA: Diagnosis not present

## 2024-06-25 DIAGNOSIS — M8000XS Age-related osteoporosis with current pathological fracture, unspecified site, sequela: Secondary | ICD-10-CM | POA: Diagnosis not present

## 2024-06-25 DIAGNOSIS — E785 Hyperlipidemia, unspecified: Secondary | ICD-10-CM | POA: Diagnosis not present

## 2024-07-06 ENCOUNTER — Other Ambulatory Visit

## 2024-07-07 DIAGNOSIS — I739 Peripheral vascular disease, unspecified: Secondary | ICD-10-CM | POA: Diagnosis not present

## 2024-07-07 DIAGNOSIS — M4316 Spondylolisthesis, lumbar region: Secondary | ICD-10-CM | POA: Diagnosis not present

## 2024-08-03 ENCOUNTER — Ambulatory Visit (HOSPITAL_COMMUNITY)
Admission: RE | Admit: 2024-08-03 | Discharge: 2024-08-03 | Disposition: A | Discharge status: Home / Self Care | Source: Ambulatory Visit | Attending: Surgery | Admitting: Surgery

## 2024-08-03 ENCOUNTER — Other Ambulatory Visit (HOSPITAL_COMMUNITY): Payer: Self-pay | Admitting: Neurosurgery

## 2024-08-03 DIAGNOSIS — I739 Peripheral vascular disease, unspecified: Secondary | ICD-10-CM | POA: Diagnosis not present

## 2024-08-03 LAB — VAS US ABI WITH/WO TBI

## 2024-09-15 ENCOUNTER — Ambulatory Visit: Admitting: Vascular Surgery

## 2024-09-15 ENCOUNTER — Encounter: Payer: Self-pay | Admitting: Vascular Surgery

## 2024-09-15 VITALS — BP 126/61 | HR 67 | Temp 98.2°F | Resp 16 | Ht 61.0 in | Wt 124.9 lb

## 2024-09-15 DIAGNOSIS — M79606 Pain in leg, unspecified: Secondary | ICD-10-CM | POA: Insufficient documentation

## 2024-09-15 DIAGNOSIS — M79605 Pain in left leg: Secondary | ICD-10-CM

## 2024-09-15 DIAGNOSIS — M79604 Pain in right leg: Secondary | ICD-10-CM

## 2024-09-15 NOTE — Progress Notes (Signed)
 "   Patient name: Kayla Cannon MRN: [id number] DOB: 07-14-1951 Sex: female  REASON FOR CONSULT: PVD with noncompressible ABI  HPI: Kayla Cannon is a 74 y.o. female, with history of hypertension and hyperlipidemia that presents for evaluation of PVD with noncompressible ABIs.  Patient states about 3 to 4 months ago she was having more cramping in her thighs when she stood up that would be very brief and then this would get better when she sat down.  She saw Dr. Onetha with neurosurgery who did not feel this was neuropathic in nature.  Since her statin therapy was changed from Crestor to rosuvastatin and back to Crestor all of her symptoms have resolved.  She is without complaints today as it relates to her lower extremity  She did have ABIs on 08/03/2024 that were noncompressible and triphasic at the ankle.  Past Medical History:  Diagnosis Date   Elevated cholesterol    Hypertension    Osteopenia 05/2016   T score -2.4 FRAX 8.7%/0.8% noting prior Fosamax use   PVC (premature ventricular contraction)     Past Surgical History:  Procedure Laterality Date   CERVICAL DISC SURGERY     CESAREAN SECTION     DILATION AND CURETTAGE OF UTERUS     ELBOW SURGERY     HYSTEROSCOPY     NASAL SINUS SURGERY     SHOULDER SURGERY      Family History  Problem Relation Age of Onset   Heart disease Mother    Lung cancer Mother    Heart disease Father    Hypertension Father    Kidney cancer Father    Heart disease Maternal Grandmother    Heart disease Maternal Grandfather    Diabetes Paternal Grandmother     SOCIAL HISTORY: Social History   Socioeconomic History   Marital status: Married    Spouse name: Not on file   Number of children: Not on file   Years of education: Not on file   Highest education level: Not on file  Occupational History   Not on file  Tobacco Use   Smoking status: Never   Smokeless tobacco: Never  Vaping Use   Vaping status: Never Used  Substance and Sexual  Activity   Alcohol use: Yes    Alcohol/week: 4.0 standard drinks of alcohol    Types: 4 Standard drinks or equivalent per week    Comment: glass of wine at night 3-4 x week   Drug use: No   Sexual activity: Yes    Birth control/protection: Post-menopausal    Comment: 1st intercourse 88 yo-5 partners  Other Topics Concern   Not on file  Social History Narrative   Not on file   Social Drivers of Health   Tobacco Use: Low Risk (09/15/2024)   Patient History    Smoking Tobacco Use: Never    Smokeless Tobacco Use: Never    Passive Exposure: Not on file  Financial Resource Strain: Not on file  Food Insecurity: Low Risk (02/26/2023)   Received from [facility]   Epic    Within the past 12 months, you worried that your food would run out before you got money to buy more: Never true    Within the past 12 months, the food you bought just didn't last and you didn't have money to get more. : Never true  Transportation Needs: No Transportation Needs (02/26/2023)   Received from [facility]    In the past  12 months, has lack of reliable transportation kept you from medical appointments, meetings, work or from getting things needed for daily living? : No  Physical Activity: Not on file  Stress: Not on file  Social Connections: Not on file  Intimate Partner Violence: Not on file  Depression (EYV7-0): Not on file  Alcohol Screen: Not on file  Housing: Low Risk (02/26/2023)   Received from [facility]   Epic    What is your living situation today?: I have a steady place to live    Think about the place you live. Do you have problems with any of the following? Choose all that apply:: Not on file  Utilities: Low Risk (02/26/2023)   Received from [facility]   Utilities    In the past 12 months has the electric, gas, oil, or water company threatened to shut off services in your home? : No  Health Literacy: Not on file    Allergies[1]  Current Outpatient Medications   Medication Sig Dispense Refill   buPROPion (WELLBUTRIN XL) 300 MG 24 hr tablet Take 300 mg by mouth daily.     Calcium Carbonate-Vitamin D  (CALCIUM + D PO) Take 2 tablets by mouth daily.      Estradiol POWD APPLY 1 ML VAGINALLY AT BEDTIME 3 TIMES A WEEK AS DIRECTED. 45 g 6   Multiple Vitamin (MULTIVITAMIN WITH MINERALS) TABS tablet Take 1 tablet by mouth daily.     NONFORMULARY OR COMPOUNDED ITEM Estradiol 0.02% Cream 45 ml. S:  Apply 1 ml. Vaginally hs 3 x weekly as directed. 45 each 4   NONFORMULARY OR COMPOUNDED ITEM Testosterone 2% cream 30 Gm. S: apply hs as directed. 30 each 3   simvastatin (ZOCOR) 40 MG tablet Take 40 mg by mouth every evening.     Teriparatide (FORTEO) 560 MCG/2.24ML SOPN Inject into the skin.     Testosterone POWD APPLY AT BEDTIME 30 g 5   topiramate (TOPAMAX) 100 MG tablet Take 100 mg by mouth daily.  0   aspirin 81 MG tablet Take 81 mg by mouth daily. (Patient not taking: Reported on 09/15/2024)     No current facility-administered medications for this visit.    REVIEW OF SYSTEMS:  [X]  denotes positive finding, [ ]  denotes negative finding Cardiac  Comments:  Chest pain or chest pressure:    Shortness of breath upon exertion:    Short of breath when lying flat:    Irregular heart rhythm:        Vascular    Pain in calf, thigh, or hip brought on by ambulation:    Pain in feet at night that wakes you up from your sleep:     Blood clot in your veins:    Leg swelling:         Pulmonary    Oxygen at home:    Productive cough:     Wheezing:         Neurologic    Sudden weakness in arms or legs:     Sudden numbness in arms or legs:     Sudden onset of difficulty speaking or slurred speech:    Temporary loss of vision in one eye:     Problems with dizziness:         Gastrointestinal    Blood in stool:     Vomited blood:         Genitourinary    Burning when urinating:     Blood in urine:  Psychiatric    Major depression:          Hematologic    Bleeding problems:    Problems with blood clotting too easily:        Skin    Rashes or ulcers:        Constitutional    Fever or chills:      PHYSICAL EXAM: Vitals:   09/15/24 1420  BP: 126/61  Pulse: 67  Resp: 16  Temp: 98.2 F (36.8 C)  TempSrc: Temporal  SpO2: 100%  Weight: 124 lb 14.4 oz (56.7 kg)  Height: 5' 1 (1.549 m)    GENERAL: The patient is a well-nourished female, in no acute distress. The vital signs are documented above. CARDIAC: There is a regular rate and rhythm.  VASCULAR:  Bilateral femoral pulses palpable Bilateral DP pulses palpable No lower extremity tissue loss PULMONARY: No respiratory distress ABDOMEN: Soft and non-tender. MUSCULOSKELETAL: There are no major deformities or cyanosis. NEUROLOGIC: No focal weakness or paresthesias are detected. SKIN: There are no ulcers or rashes noted. PSYCHIATRIC: The patient has a normal affect.  DATA:   ABIs on 08/03/2024 that were noncompressible and triphasic at the ankle.  Assessment/Plan:  74 y.o. female, with history of hypertension and hyperlipidemia that presents for evaluation of PVD with noncompressible ABIs.  Patient states about 3 to 4 months ago she was having more cramping in her thighs when she stood up that would be very brief and then this would get better when she sat down.  She saw Dr. Onetha with neurosurgery who did not feel this was neuropathic in nature.  Since her statin therapy was changed from Crestor to rosuvastatin and back to Crestor all of her symptoms have resolved.   She associates her thigh discomfort and weakness starting around the initial statin change off Crestor that she had been taking for years when she was switched to a different medicaiton.  Fortunately all of her lower extremity symptoms have resolved on evaluation today.  I agree this could be likely related to her statin therapy changes.  She has palpable dorsalis pedis pulses bilaterally.  Her ABIs  although were noncompressible she has nice triphasic waveforms at the ankle and appears to have more than adequate perfusion at baseline.  I offered her reassurance.  She does not endorse any claudication symptoms when walking and certainly no evidence of CLI. I will arrange follow-up in 2 years just given the noncompressible nature of her ABIs with a strong family history of peripheral arterial disease for repeat studies and ongoing surveillance.   Lonni DOROTHA Gaskins, MD [facility]: [phone]        [1] No Known Allergies  "
